# Patient Record
Sex: Female | Born: 1975 | Race: White | Hispanic: No | Smoking: Former smoker
Health system: Southern US, Community
[De-identification: ages and names within clinical notes are randomized; demographics above are authoritative.]

## PROBLEM LIST (undated history)

## (undated) DIAGNOSIS — F209 Schizophrenia, unspecified: Secondary | ICD-10-CM

## (undated) DIAGNOSIS — R569 Unspecified convulsions: Secondary | ICD-10-CM

## (undated) DIAGNOSIS — I519 Heart disease, unspecified: Secondary | ICD-10-CM

## (undated) DIAGNOSIS — F1920 Other psychoactive substance dependence, uncomplicated: Secondary | ICD-10-CM

## (undated) HISTORY — PX: CARDIAC VALVE REPLACEMENT: SHX585

## (undated) HISTORY — PX: ORIF PROXIMAL HUMERUS FRACTURE W PROSTHETIC REPLACEMENT: SUR952

## (undated) HISTORY — DX: Other psychoactive substance dependence, uncomplicated: F19.20

## (undated) HISTORY — DX: Heart disease, unspecified: I51.9

## (undated) HISTORY — PX: ORIF ULNAR FRACTURE: SHX5417

---

## 2005-05-23 HISTORY — PX: BONE FLAP RECONSTRUCTION SKULL: SHX5176

## 2017-03-25 ENCOUNTER — Encounter (HOSPITAL_COMMUNITY): Payer: Self-pay | Admitting: Emergency Medicine

## 2017-03-25 ENCOUNTER — Emergency Department (HOSPITAL_COMMUNITY)
Admission: EM | Admit: 2017-03-25 | Discharge: 2017-03-25 | Disposition: A | Discharge status: Home / Self Care | Payer: Self-pay | Attending: Emergency Medicine | Admitting: Emergency Medicine

## 2017-03-25 DIAGNOSIS — Z87891 Personal history of nicotine dependence: Secondary | ICD-10-CM | POA: Insufficient documentation

## 2017-03-25 DIAGNOSIS — F209 Schizophrenia, unspecified: Secondary | ICD-10-CM | POA: Insufficient documentation

## 2017-03-25 DIAGNOSIS — Z79899 Other long term (current) drug therapy: Secondary | ICD-10-CM | POA: Insufficient documentation

## 2017-03-25 DIAGNOSIS — R569 Unspecified convulsions: Secondary | ICD-10-CM | POA: Insufficient documentation

## 2017-03-25 HISTORY — DX: Schizophrenia, unspecified: F20.9

## 2017-03-25 HISTORY — DX: Unspecified convulsions: R56.9

## 2017-03-25 LAB — CBC WITH DIFFERENTIAL/PLATELET
BASOS ABS: 0 10*3/uL (ref 0.0–0.1)
BASOS PCT: 0 %
EOS PCT: 5 %
Eosinophils Absolute: 0.3 10*3/uL (ref 0.0–0.7)
HCT: 37.5 % (ref 36.0–46.0)
Hemoglobin: 12.1 g/dL (ref 12.0–15.0)
Lymphocytes Relative: 33 %
Lymphs Abs: 1.9 10*3/uL (ref 0.7–4.0)
MCH: 31 pg (ref 26.0–34.0)
MCHC: 32.3 g/dL (ref 30.0–36.0)
MCV: 96.2 fL (ref 78.0–100.0)
MONO ABS: 0.5 10*3/uL (ref 0.1–1.0)
Monocytes Relative: 8 %
Neutro Abs: 3.2 10*3/uL (ref 1.7–7.7)
Neutrophils Relative %: 54 %
PLATELETS: 210 10*3/uL (ref 150–400)
RBC: 3.9 MIL/uL (ref 3.87–5.11)
RDW: 14.2 % (ref 11.5–15.5)
WBC: 5.9 10*3/uL (ref 4.0–10.5)

## 2017-03-25 LAB — COMPREHENSIVE METABOLIC PANEL
ALBUMIN: 3.2 g/dL — AB (ref 3.5–5.0)
ALT: 11 U/L — ABNORMAL LOW (ref 14–54)
ANION GAP: 9 (ref 5–15)
AST: 16 U/L (ref 15–41)
Alkaline Phosphatase: 49 U/L (ref 38–126)
BILIRUBIN TOTAL: 0.6 mg/dL (ref 0.3–1.2)
BUN: 16 mg/dL (ref 6–20)
CALCIUM: 8.5 mg/dL — AB (ref 8.9–10.3)
CO2: 25 mmol/L (ref 22–32)
Chloride: 104 mmol/L (ref 101–111)
Creatinine, Ser: 0.6 mg/dL (ref 0.44–1.00)
Glucose, Bld: 97 mg/dL (ref 65–99)
Potassium: 3.7 mmol/L (ref 3.5–5.1)
Sodium: 138 mmol/L (ref 135–145)
TOTAL PROTEIN: 6.3 g/dL — AB (ref 6.5–8.1)

## 2017-03-25 LAB — RAPID URINE DRUG SCREEN, HOSP PERFORMED
AMPHETAMINES: NOT DETECTED
BARBITURATES: NOT DETECTED
BENZODIAZEPINES: NOT DETECTED
Cocaine: NOT DETECTED
Opiates: NOT DETECTED
TETRAHYDROCANNABINOL: NOT DETECTED

## 2017-03-25 LAB — URINALYSIS, ROUTINE W REFLEX MICROSCOPIC
BILIRUBIN URINE: NEGATIVE
Glucose, UA: NEGATIVE mg/dL
Hgb urine dipstick: NEGATIVE
KETONES UR: NEGATIVE mg/dL
Leukocytes, UA: NEGATIVE
NITRITE: NEGATIVE
Protein, ur: NEGATIVE mg/dL
Specific Gravity, Urine: 1.015 (ref 1.005–1.030)
pH: 7 (ref 5.0–8.0)

## 2017-03-25 LAB — CBG MONITORING, ED: GLUCOSE-CAPILLARY: 100 mg/dL — AB (ref 65–99)

## 2017-03-25 LAB — VALPROIC ACID LEVEL: VALPROIC ACID LVL: 29 ug/mL — AB (ref 50.0–100.0)

## 2017-03-25 LAB — LIPASE, BLOOD: LIPASE: 22 U/L (ref 11–51)

## 2017-03-25 MED ORDER — DIVALPROEX SODIUM 250 MG PO DR TAB: 250.0000 mg | DELAYED_RELEASE_TABLET | Freq: Two times a day (BID) | ORAL | 1 refills | Status: DC

## 2017-03-25 MED ORDER — SODIUM CHLORIDE 0.9 % IV BOLUS (SEPSIS)
1000.0000 mL | Freq: Once | INTRAVENOUS | Status: AC
  Administered 2017-03-25: 1000 mL via INTRAVENOUS

## 2017-03-25 MED ORDER — ONDANSETRON HCL 4 MG/2ML IJ SOLN
4.0000 mg | Freq: Once | INTRAMUSCULAR | Status: AC
  Administered 2017-03-25: 4 mg via INTRAVENOUS
  Filled 2017-03-25: qty 2

## 2017-03-25 MED ORDER — DIVALPROEX SODIUM 500 MG PO DR TAB: 500.0000 mg | DELAYED_RELEASE_TABLET | Freq: Two times a day (BID) | ORAL | 1 refills | Status: DC

## 2017-03-25 MED ORDER — QUETIAPINE FUMARATE 100 MG PO TABS: 100.0000 mg | ORAL_TABLET | Freq: Every day | ORAL | 1 refills | Status: DC

## 2017-03-25 MED ORDER — LORAZEPAM 2 MG/ML IJ SOLN
2.0000 mg | Freq: Once | INTRAMUSCULAR | Status: AC
  Administered 2017-03-25: 2 mg via INTRAVENOUS
  Filled 2017-03-25: qty 1

## 2017-03-25 NOTE — ED Notes (Signed)
Bed: [id number] Expected date: 03/25/17 Expected time: 12:33 PM Means of arrival: Ambulance Comments: Seizures

## 2017-03-25 NOTE — ED Triage Notes (Signed)
Pt from [facility](half-way house). Per EMS, pt has been at this facility for 2 weeks. 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 Staff has taken pt off of her rx'd Tramadol, Vistaril, Cymbalta, and Seroquel. Pt was allowed to stay on her Depakote for hx of seizures. EMS reports that the pt was not postictal upon arrival, but had a witnessed seizure per staff. Pt did not hit head or suffer other injuries during seizure. Pt biggest concern is that she does not want to lose her spot at [facility]. Pt is A&O and in NAD

## 2017-03-25 NOTE — Discharge Instructions (Signed)
Prescription for Depakote and Seroquel.  Increase fluids.  Avoid stress.

## 2017-03-25 NOTE — ED Triage Notes (Signed)
Pt reports that she has been at [facility] for 15 days. 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 Pt is A&O, speaks in complete sentences, but is visibly shaking and anxious. Pt in NAD and VSS
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
## 2017-05-12 ENCOUNTER — Other Ambulatory Visit: Payer: Self-pay

## 2017-05-12 ENCOUNTER — Emergency Department (HOSPITAL_COMMUNITY)
Admission: EM | Admit: 2017-05-12 | Discharge: 2017-05-12 | Disposition: A | Discharge status: Home / Self Care | Payer: Self-pay | Attending: Emergency Medicine | Admitting: Emergency Medicine

## 2017-05-12 ENCOUNTER — Emergency Department (HOSPITAL_COMMUNITY): Payer: Self-pay

## 2017-05-12 ENCOUNTER — Encounter (HOSPITAL_COMMUNITY): Payer: Self-pay | Admitting: Emergency Medicine

## 2017-05-12 DIAGNOSIS — Z87891 Personal history of nicotine dependence: Secondary | ICD-10-CM | POA: Insufficient documentation

## 2017-05-12 DIAGNOSIS — R569 Unspecified convulsions: Secondary | ICD-10-CM | POA: Insufficient documentation

## 2017-05-12 DIAGNOSIS — Z79899 Other long term (current) drug therapy: Secondary | ICD-10-CM | POA: Insufficient documentation

## 2017-05-12 LAB — URINALYSIS, ROUTINE W REFLEX MICROSCOPIC
BILIRUBIN URINE: NEGATIVE
GLUCOSE, UA: NEGATIVE mg/dL
HGB URINE DIPSTICK: NEGATIVE
KETONES UR: 5 mg/dL — AB
LEUKOCYTES UA: NEGATIVE
NITRITE: NEGATIVE
PROTEIN: 30 mg/dL — AB
Specific Gravity, Urine: 1.036 — ABNORMAL HIGH (ref 1.005–1.030)
pH: 5 (ref 5.0–8.0)

## 2017-05-12 LAB — CBC WITH DIFFERENTIAL/PLATELET
BASOS ABS: 0 10*3/uL (ref 0.0–0.1)
Basophils Relative: 0 %
EOS ABS: 0.5 10*3/uL (ref 0.0–0.7)
EOS PCT: 7 %
HCT: 40.2 % (ref 36.0–46.0)
Hemoglobin: 13.1 g/dL (ref 12.0–15.0)
Lymphocytes Relative: 42 %
Lymphs Abs: 2.9 10*3/uL (ref 0.7–4.0)
MCH: 30.1 pg (ref 26.0–34.0)
MCHC: 32.6 g/dL (ref 30.0–36.0)
MCV: 92.4 fL (ref 78.0–100.0)
MONO ABS: 0.4 10*3/uL (ref 0.1–1.0)
Monocytes Relative: 6 %
Neutro Abs: 3.1 10*3/uL (ref 1.7–7.7)
Neutrophils Relative %: 45 %
PLATELETS: 213 10*3/uL (ref 150–400)
RBC: 4.35 MIL/uL (ref 3.87–5.11)
RDW: 13.7 % (ref 11.5–15.5)
WBC: 6.9 10*3/uL (ref 4.0–10.5)

## 2017-05-12 LAB — RAPID URINE DRUG SCREEN, HOSP PERFORMED
Amphetamines: NOT DETECTED
Barbiturates: NOT DETECTED
Benzodiazepines: NOT DETECTED
Cocaine: NOT DETECTED
OPIATES: NOT DETECTED
Tetrahydrocannabinol: NOT DETECTED

## 2017-05-12 LAB — COMPREHENSIVE METABOLIC PANEL
ALT: 9 U/L — AB (ref 14–54)
AST: 18 U/L (ref 15–41)
Albumin: 3.5 g/dL (ref 3.5–5.0)
Alkaline Phosphatase: 56 U/L (ref 38–126)
Anion gap: 6 (ref 5–15)
BUN: 11 mg/dL (ref 6–20)
CHLORIDE: 105 mmol/L (ref 101–111)
CO2: 26 mmol/L (ref 22–32)
CREATININE: 0.5 mg/dL (ref 0.44–1.00)
Calcium: 8.6 mg/dL — ABNORMAL LOW (ref 8.9–10.3)
GFR calc non Af Amer: >60 mL/min (ref >60)
Glucose, Bld: 90 mg/dL (ref 65–99)
POTASSIUM: 3.9 mmol/L (ref 3.5–5.1)
SODIUM: 137 mmol/L (ref 135–145)
Total Bilirubin: 0.8 mg/dL (ref 0.3–1.2)
Total Protein: 6.8 g/dL (ref 6.5–8.1)

## 2017-05-12 LAB — I-STAT BETA HCG BLOOD, ED (MC, WL, AP ONLY)

## 2017-05-12 LAB — CBG MONITORING, ED: GLUCOSE-CAPILLARY: 82 mg/dL (ref 65–99)

## 2017-05-12 LAB — I-STAT TROPONIN, ED: TROPONIN I, POC: 0 ng/mL (ref 0.00–0.08)

## 2017-05-12 LAB — VALPROIC ACID LEVEL: VALPROIC ACID LVL: 68 ug/mL (ref 50.0–100.0)

## 2017-05-12 MED ORDER — IBUPROFEN 200 MG PO TABS
600.0000 mg | ORAL_TABLET | Freq: Once | ORAL | Status: AC
  Administered 2017-05-12: 600 mg via ORAL
  Filled 2017-05-12: qty 3

## 2017-05-12 NOTE — Discharge Instructions (Signed)
Your evaluation today is reassuring, labs are reassuring, and Depakote level is appropriate. Continue to take your seizure medications as directed.  [facility]up with [facility] regarding back pain, if you develop numbness, weakness or difficulty walking, or other new or concerning symptoms return to the ED for sooner reevaluation.  You may also schedule appointment [facility] for continued management of your seizures.

## 2017-05-12 NOTE — ED Provider Notes (Signed)
Medical screening examination/treatment/procedure(s) were conducted as a shared visit with non-physician practitioner(s) and myself.  I personally evaluated the patient during the encounter.   EKG Interpretation  Date/Time:  Friday May 12 2017 15:10:29 EST Ventricular Rate:  85 PR Interval:    QRS Duration: 99 QT Interval:  403 QTC Calculation: 480 R Axis:   59 Text Interpretation:  Sinus rhythm Borderline T wave abnormalities No previous tracing Confirmed by Vanetta MuldersZackowski, Zaheer Wageman [phone]([id number]) on 05/12/2017 3:21:15 PM      Patient seen by me along with the physician assistant.  Patient currently in a rehab situation.  Patient had a witnessed seizure today at 10 this morning that lasted for minutes.  Associated with incontinence of urine.  Since that time she has had ongoing generalized weakness mostly in her legs this is normal for her for her postictal period which been different is that for the past several days to weeks she has had the lower extremity pain which is bilateral.  Patient's Depakote levels are normal.  Patient back to baseline.  Patient most likely can be discharged home.  Outpatient follow-up for the persistent leg pain.  Patient currently stable.  Labs still pending.  If labs do not warrant admission and patient is stable for discharge home.  Results for orders placed or performed during the hospital encounter of 05/12/17  Valproic acid level  Result Value Ref Range   Valproic Acid Lvl 68 50.0 - 100.0 ug/mL  CBC WITH DIFFERENTIAL  Result Value Ref Range   WBC 6.9 4.0 - 10.5 K/uL   RBC 4.35 3.87 - 5.11 MIL/uL   Hemoglobin 13.1 12.0 - 15.0 g/dL   HCT 11.940.2 14.736.0 - 82.946.0 %   MCV 92.4 78.0 - 100.0 fL   MCH 30.1 26.0 - 34.0 pg   MCHC 32.6 30.0 - 36.0 g/dL   RDW 56.213.7 13.011.5 - 86.515.5 %   Platelets 213 150 - 400 K/uL   Neutrophils Relative % 45 %   Neutro Abs 3.1 1.7 - 7.7 K/uL   Lymphocytes Relative 42 %   Lymphs Abs 2.9 0.7 - 4.0 K/uL   Monocytes Relative 6 %   Monocytes  Absolute 0.4 0.1 - 1.0 K/uL   Eosinophils Relative 7 %   Eosinophils Absolute 0.5 0.0 - 0.7 K/uL   Basophils Relative 0 %   Basophils Absolute 0.0 0.0 - 0.1 K/uL  Comprehensive metabolic panel  Result Value Ref Range   Sodium 137 135 - 145 mmol/L   Potassium 3.9 3.5 - 5.1 mmol/L   Chloride 105 101 - 111 mmol/L   CO2 26 22 - 32 mmol/L   Glucose, Bld 90 65 - 99 mg/dL   BUN 11 6 - 20 mg/dL   Creatinine, Ser 7.840.50 0.44 - 1.00 mg/dL   Calcium 8.6 (L) 8.9 - 10.3 mg/dL   Total Protein 6.8 6.5 - 8.1 g/dL   Albumin 3.5 3.5 - 5.0 g/dL   AST 18 15 - 41 U/L   ALT 9 (L) 14 - 54 U/L   Alkaline Phosphatase 56 38 - 126 U/L   Total Bilirubin 0.8 0.3 - 1.2 mg/dL   GFR calc non Af Amer >60 >60 mL/min   GFR calc Af Amer >60 >60 mL/min   Anion gap 6 5 - 15  Urinalysis, Routine w reflex microscopic  Result Value Ref Range   Color, Urine YELLOW YELLOW   APPearance CLOUDY (A) CLEAR   Specific Gravity, Urine 1.036 (H) 1.005 - 1.030   pH 5.0 5.0 -  8.0   Glucose, UA NEGATIVE NEGATIVE mg/dL   Hgb urine dipstick NEGATIVE NEGATIVE   Bilirubin Urine NEGATIVE NEGATIVE   Ketones, ur 5 (A) NEGATIVE mg/dL   Protein, ur 30 (A) NEGATIVE mg/dL   Nitrite NEGATIVE NEGATIVE   Leukocytes, UA NEGATIVE NEGATIVE   RBC / HPF 0-5 0 - 5 RBC/hpf   WBC, UA 6-30 0 - 5 WBC/hpf   Bacteria, UA FEW (A) NONE SEEN   Squamous Epithelial / LPF TOO NUMEROUS TO COUNT (A) NONE SEEN   Mucus PRESENT   Urine rapid drug screen (hosp performed)  Result Value Ref Range   Opiates NONE DETECTED NONE DETECTED   Cocaine NONE DETECTED NONE DETECTED   Benzodiazepines NONE DETECTED NONE DETECTED   Amphetamines NONE DETECTED NONE DETECTED   Tetrahydrocannabinol NONE DETECTED NONE DETECTED   Barbiturates NONE DETECTED NONE DETECTED  CBG monitoring, ED  Result Value Ref Range   Glucose-Capillary 82 65 - 99 mg/dL   Comment 1 Notify RN    Comment 2 Document in Chart   I-Stat beta hCG blood, ED  Result Value Ref Range   I-stat hCG,  quantitative <5.0 <5 mIU/mL   Comment 3              Vanetta MuldersZackowski, Marialy Urbanczyk, MD 05/12/17 1904
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
## 2017-05-12 NOTE — ED Triage Notes (Signed)
Pt complaint of seizure at 1000 today; seizure witnessed lasting 4 minutes; incontinence with seizure; ongoing generalized weakness since event.

## 2017-05-12 NOTE — ED Notes (Signed)
Bed: WA08 Expected date:  Expected time:  Means of arrival:  Comments: Triage 1 

## 2017-05-17 ENCOUNTER — Encounter: Payer: Self-pay | Admitting: Family Medicine

## 2017-05-17 ENCOUNTER — Ambulatory Visit (INDEPENDENT_AMBULATORY_CARE_PROVIDER_SITE_OTHER): Payer: Self-pay | Admitting: Family Medicine

## 2017-05-17 VITALS — BP 121/76 | HR 94 | Temp 97.9°F | Resp 18 | Ht 58.5 in | Wt 240.0 lb

## 2017-05-17 DIAGNOSIS — E8881 Metabolic syndrome: Secondary | ICD-10-CM

## 2017-05-17 DIAGNOSIS — G40401 Other generalized epilepsy and epileptic syndromes, not intractable, with status epilepticus: Secondary | ICD-10-CM

## 2017-05-17 DIAGNOSIS — Z1231 Encounter for screening mammogram for malignant neoplasm of breast: Secondary | ICD-10-CM

## 2017-05-17 DIAGNOSIS — N926 Irregular menstruation, unspecified: Secondary | ICD-10-CM

## 2017-05-17 DIAGNOSIS — F3130 Bipolar disorder, current episode depressed, mild or moderate severity, unspecified: Secondary | ICD-10-CM

## 2017-05-17 DIAGNOSIS — F319 Bipolar disorder, unspecified: Secondary | ICD-10-CM

## 2017-05-17 LAB — POCT GLYCOSYLATED HEMOGLOBIN (HGB A1C): HEMOGLOBIN A1C: 5.3

## 2017-05-17 MED ORDER — DIVALPROEX SODIUM 250 MG PO DR TAB: 250.0000 mg | DELAYED_RELEASE_TABLET | Freq: Two times a day (BID) | ORAL | 5 refills | Status: DC

## 2017-05-17 MED ORDER — DIVALPROEX SODIUM 500 MG PO DR TAB: 500.0000 mg | DELAYED_RELEASE_TABLET | Freq: Two times a day (BID) | ORAL | 5 refills | Status: DC
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
## 2017-05-17 NOTE — Patient Instructions (Addendum)
Recommend a lowfat, low carbohydrate diet divided over 5-6 small meals, increase water intake to 6-8 glasses, and 150 minutes per week of cardiovascular exercise.   Will send an order for screening mammogram   Will follow up by phone with any laboratory results  Sent a referral to neurololgy for workup of epilepsy  Sent a referral to psychiatry for history of bipolar depression   Menopause is the time that marks the end of your menstrual cycles. It's diagnosed after you've gone 12 months without a menstrual period.  Menopause can happen in your 4040s or 6350s, but the average age is 6451 in the [place]. Menopause is a natural biological process. But the physical symptoms, such as hot flashes, and emotional symptoms of menopause may disrupt your sleep, lower your energy or affect emotional health.  There are many effective treatments available, from lifestyle adjustments to hormone therapy.   Epilepsy Epilepsy is when a person keeps having seizures. A seizure is unusual activity in the brain. A seizure can change how you think or behave, and it can make it hard to be aware of what is happening. This condition can cause problems, such as:  Falls, accidents, and injury.  Depression.  Poor memory.  Sudden unexplained death in epilepsy (SUDEP). This is rare. Its cause is not known.  Most people with epilepsy lead normal lives. Follow these instructions at home: Medicines   Take medicines only as told by your doctor.  Avoid anything that may keep your medicine from working, such as alcohol. Activity  Get enough rest. Lack of sleep can make seizures more likely to occur.  Follow your doctor's advice about driving, swimming, and doing anything else that would be dangerous if you had a seizure. Teaching others Teach friends and family what to do if you have a seizure. They should:  Lay you on the ground to prevent a fall.  Cushion your head and body.  Loosen any tight clothing  around your neck.  Turn you on your side.  Stay with you until you are better.  Not hold you down.  Not put anything in your mouth.  Know whether or not you need emergency care.  General instructions  Avoid anything that causes you to have seizures.  Keep a seizure diary. Write down what you remember about each seizure, and especially what might have caused it.  Keep all follow-up visits as told by your doctor. This is important. Contact a doctor if:  You have a change in your seizure pattern.  You get an infection or start to feel sick. You may have more seizures when you are sick. Get help right away if:  A seizure does not stop after 5 minutes.  You have more than one seizure in a row, and you do not have enough time between the seizures to feel better.  A seizure makes it harder to breathe.  A seizure is different from other seizures you have had.  A seizure makes you unable to speak or use a part of your body.  You did not wake up right after a seizure. This information is not intended to replace advice given to you by your health care provider. Make sure you discuss any questions you have with your health care provider. Document Released: 03/06/2009 Document Revised: 12/14/2015 Document Reviewed: 11/17/2015 Elsevier Interactive Patient Education  [employer]2018 Elsevier Inc.

## 2017-05-18 ENCOUNTER — Other Ambulatory Visit: Payer: Self-pay | Admitting: Family Medicine

## 2017-05-18 DIAGNOSIS — E559 Vitamin D deficiency, unspecified: Secondary | ICD-10-CM

## 2017-05-18 LAB — VITAMIN D 25 HYDROXY (VIT D DEFICIENCY, FRACTURES): VIT D 25 HYDROXY: 19.4 ng/mL — AB (ref 30.0–100.0)

## 2017-05-18 LAB — TSH: TSH: 1.09 u[IU]/mL (ref 0.450–4.500)

## 2017-05-18 MED ORDER — ERGOCALCIFEROL 1.25 MG (50000 UT) PO CAPS: 50000.0000 [IU] | ORAL_CAPSULE | ORAL | 0 refills | Status: DC

## 2017-05-18 NOTE — Progress Notes (Signed)
Meds ordered this encounter  Medications  . ergocalciferol (VITAMIN D2) 50000 units capsule    Sig: Take 1 capsule (50,000 Units total) by mouth once a week.    Dispense:  30 capsule    Refill:  0     Nolon NationsLachina Moore Ameirah Khatoon  MSN, FNP-C Patient [facility] [address]  [place], [place] [address] [phone]

## 2017-05-19 ENCOUNTER — Telehealth: Payer: Self-pay

## 2017-05-19 NOTE — Telephone Encounter (Signed)
-----   Message from Massie MaroonLachina M Hollis, [place] sent at 05/18/2017  4:37 PM EST ----- Regarding: lab results Please inform patient of vitamin d deficiency. Will start weekly Drisdol 50,000 units per week. Will recheck level in 3 months. All other labs are within normal limits.   Thanks

## 2017-05-19 NOTE — Telephone Encounter (Signed)
Called and spoke with patient. Advised that vitamin D was low and that she should start Drisdol 50,000 units once per week. Advised that medication was sent into pharmacy and that we will recheck level in 3 months. Advised that all other labs were normal and to keep next scheduled appointment. Patient verbalized understanding. Thanks!

## 2017-05-20 DIAGNOSIS — G40909 Epilepsy, unspecified, not intractable, without status epilepticus: Secondary | ICD-10-CM | POA: Insufficient documentation

## 2017-05-20 DIAGNOSIS — E8881 Metabolic syndrome: Secondary | ICD-10-CM | POA: Insufficient documentation

## 2017-06-06 ENCOUNTER — Ambulatory Visit: Payer: Self-pay | Attending: Internal Medicine

## 2017-06-14 ENCOUNTER — Ambulatory Visit (INDEPENDENT_AMBULATORY_CARE_PROVIDER_SITE_OTHER): Payer: Self-pay | Admitting: Family Medicine

## 2017-06-14 ENCOUNTER — Encounter: Payer: Self-pay | Admitting: Family Medicine

## 2017-06-14 VITALS — BP 127/73 | HR 85 | Temp 97.7°F | Resp 18 | Ht 58.5 in | Wt 235.0 lb

## 2017-06-14 DIAGNOSIS — J029 Acute pharyngitis, unspecified: Secondary | ICD-10-CM

## 2017-06-14 DIAGNOSIS — R6883 Chills (without fever): Secondary | ICD-10-CM

## 2017-06-14 DIAGNOSIS — R05 Cough: Secondary | ICD-10-CM

## 2017-06-14 DIAGNOSIS — R059 Cough, unspecified: Secondary | ICD-10-CM

## 2017-06-14 DIAGNOSIS — R0981 Nasal congestion: Secondary | ICD-10-CM

## 2017-06-14 LAB — POCT RAPID STREP A (OFFICE): Rapid Strep A Screen: NEGATIVE

## 2017-06-14 LAB — POCT INFLUENZA A/B
Influenza A, POC: NEGATIVE
Influenza B, POC: NEGATIVE

## 2017-06-14 MED ORDER — ACETAMINOPHEN 500 MG PO TABS: 500.0000 mg | ORAL_TABLET | Freq: Four times a day (QID) | ORAL | 0 refills | Status: DC | PRN

## 2017-06-14 MED ORDER — BENZONATATE 100 MG PO CAPS: 100.0000 mg | ORAL_CAPSULE | Freq: Three times a day (TID) | ORAL | 0 refills | Status: DC | PRN

## 2017-06-14 MED FILL — BENZONATATE 100 MG CAPSULE: 100 | 10 days supply | Qty: 30 | Fill #0

## 2017-06-14 NOTE — Patient Instructions (Addendum)
Your strep test was negative.  Will follow strep culture to completion. Recommend warm salt water gargles as needed for throat pain.  Also recommend Tylenol 500 mg every 6 hours as needed for mild to moderate pain.  For upper respiratory symptoms, increase fluids, rest, and handwashing.  Your influenza test was also negative. Sore Throat When you have a sore throat, your throat may:  Hurt.  Burn.  Feel irritated.  Feel scratchy.  Many things can cause a sore throat, including:  An infection.  Allergies.  Dryness in the air.  Smoke or pollution.  Gastroesophageal reflux disease (GERD).  A tumor.  A sore throat can be the first sign of another sickness. It can happen with other problems, like coughing or a fever. Most sore throats go away without treatment. Follow these instructions at home:  Take over-the-counter medicines only as told by your doctor.  Drink enough fluids to keep your pee (urine) clear or pale yellow.  Rest when you feel you need to.  To help with pain, try: ? Sipping warm liquids, such as broth, herbal tea, or warm water. ? Eating or drinking cold or frozen liquids, such as frozen ice pops. ? Gargling with a salt-water mixture 3-4 times a day or as needed. To make a salt-water mixture, add -1 tsp of salt in 1 cup of warm water. Mix it until you cannot see the salt anymore. ? Sucking on hard candy or throat lozenges. ? Putting a cool-mist humidifier in your bedroom at night. ? Sitting in the bathroom with the door closed for 5-10 minutes while you run hot water in the shower.  Do not use any tobacco products, such as cigarettes, chewing tobacco, and e-cigarettes. If you need help quitting, ask your doctor. Contact a doctor if:  You have a fever for more than 2-3 days.  You keep having symptoms for more than 2-3 days.  Your throat does not get better in 7 days.  You have a fever and your symptoms suddenly get worse. Get help right away if:  You  have trouble breathing.  You cannot swallow fluids, soft foods, or your saliva.  You have swelling in your throat or neck that gets worse.  You keep feeling like you are going to throw up (vomit).  You keep throwing up. This information is not intended to replace advice given to you by your health care provider. Make sure you discuss any questions you have with your health care provider. Document Released: 02/16/2008 Document Revised: 01/03/2016 Document Reviewed: 02/27/2015 Elsevier Interactive Patient Education  2018 Elsevier Inc.  Upper Respiratory Infection, Adult Most upper respiratory infections (URIs) are caused by a virus. A URI affects the nose, throat, and upper air passages. The most common type of URI is often called "the common cold." Follow these instructions at home:  Take medicines only as told by your doctor.  Gargle warm saltwater or take cough drops to comfort your throat as told by your doctor.  Use a warm mist humidifier or inhale steam from a shower to increase air moisture. This may make it easier to breathe.  Drink enough fluid to keep your pee (urine) clear or pale yellow.  Eat soups and other clear broths.  Have a healthy diet.  Rest as needed.  Go back to work when your fever is gone or your doctor says it is okay. ? You may need to stay home longer to avoid giving your URI to others. ? You can also wear  a face mask and wash your hands often to prevent spread of the virus.  Use your inhaler more if you have asthma.  Do not use any tobacco products, including cigarettes, chewing tobacco, or electronic cigarettes. If you need help quitting, ask your doctor. Contact a doctor if:  You are getting worse, not better.  Your symptoms are not helped by medicine.  You have chills.  You are getting more short of breath.  You have brown or red mucus.  You have yellow or brown discharge from your nose.  You have pain in your face, especially when you  bend forward.  You have a fever.  You have puffy (swollen) neck glands.  You have pain while swallowing.  You have white areas in the back of your throat. Get help right away if:  You have very bad or constant: ? Headache. ? Ear pain. ? Pain in your forehead, behind your eyes, and over your cheekbones (sinus pain). ? Chest pain.  You have long-lasting (chronic) lung disease and any of the following: ? Wheezing. ? Long-lasting cough. ? Coughing up blood. ? A change in your usual mucus.  You have a stiff neck.  You have changes in your: ? Vision. ? Hearing. ? Thinking. ? Mood. This information is not intended to replace advice given to you by your health care provider. Make sure you discuss any questions you have with your health care provider. Document Released: 10/26/2007 Document Revised: 01/10/2016 Document Reviewed: 08/14/2013 Elsevier Interactive Patient Education  2018 [employer]

## 2017-06-14 NOTE — Progress Notes (Signed)
Subjective:     Rhonda Bender is a 42 y.o. female who presents for evaluation of upper respiratory symptoms. Symptoms include achiness, congestion, cough described as productive, post nasal drip and sore throat. Onset of symptoms was 3 days ago, and have been worsening since that time. Patient has not attempted OTC interventions to alleviate current symptoms.    Past Medical History:  Diagnosis Date  . Schizophrenia (HCC)   . Seizures (HCC)    Social History   Socioeconomic History  . Marital status: Divorced    Spouse name: Not on file  . Number of children: Not on file  . Years of education: Not on file  . Highest education level: Not on file  Social Needs  . Financial resource strain: Not on file  . Food insecurity - worry: Not on file  . Food insecurity - inability: Not on file  . Transportation needs - medical: Not on file  . Transportation needs - non-medical: Not on file  Occupational History  . Not on file  Tobacco Use  . Smoking status: Former Smoker    Last attempt to quit: 03/04/2017    Years since quitting: 0.2  . Smokeless tobacco: Never Used  Substance and Sexual Activity  . Alcohol use: No  . Drug use: No  . Sexual activity: Not Currently  Other Topics Concern  . Not on file  Social History Narrative  . Not on file    There is no immunization history on file for this patient. Allergies  Allergen Reactions  . Toradol [Ketorolac Tromethamine] Palpitations    CARDIAC  ARREST  Review of Systems  Constitutional: Positive for chills and malaise/fatigue.  HENT: Positive for congestion and sore throat.   Respiratory: Positive for cough.   Cardiovascular: Negative.   Gastrointestinal: Negative for abdominal pain and nausea.  Genitourinary: Negative.   Musculoskeletal: Negative.   Skin: Negative.   Neurological: Negative.   Psychiatric/Behavioral: Negative.     Objective:  Physical Exam  Constitutional: She appears ill.  HENT:  Right Ear: Tympanic  membrane is erythematous.  Left Ear: Tympanic membrane is erythematous.  Nose: Mucosal edema present.  Mouth/Throat: Posterior oropharyngeal erythema present.  Cardiovascular: Normal rate, regular rhythm and normal heart sounds.  Pulmonary/Chest: Effort normal and breath sounds normal.  Abdominal: Soft.    Assessment:    viral upper respiratory illness   Plan:  1. Sore throat Recommend warm salt water gargles.  - Rapid Strep A - Culture, Group A Strep  2. Chills (without fever) - Influenza A/B - acetaminophen (TYLENOL) 500 MG tablet; Take 1 tablet (500 mg total) by mouth every 6 (six) hours as needed.  Dispense: 30 tablet; Refill: 0  3. Cough in adult - benzonatate (TESSALON PERLES) 100 MG capsule; Take 1 capsule (100 mg total) by mouth 3 (three) times daily as needed for cough.  Dispense: 30 capsule; Refill: 0  4. Nasal congestion  Discussed diagnosis and treatment of URI. Suggested symptomatic OTC remedies. Nasal saline spray for congestion. Follow up as needed.     RTC: Follow up as needed   The patient was given clear instructions to go to ER or return to medical center if symptoms do not improve, worsen or new problems develop. The patient verbalized understanding.   Rhonda NationsLachina Moore Davy Westmoreland  MSN, FNP-C [facility] [address]  [place], [place] [address] [phone]

## 2017-06-17 LAB — CULTURE, GROUP A STREP: STREP A CULTURE: NEGATIVE

## 2017-06-22 MED FILL — DIVALPROEX SOD DR 500 MG TA: 500 | 30 days supply | Qty: 60 | Fill #0

## 2017-06-22 MED FILL — DIVALPROEX SOD DR 250 MG TA: 250 | 30 days supply | Qty: 60 | Fill #0

## 2017-07-11 ENCOUNTER — Encounter: Payer: Self-pay | Admitting: Neurology

## 2017-07-11 ENCOUNTER — Other Ambulatory Visit: Payer: Self-pay | Admitting: Obstetrics and Gynecology

## 2017-07-11 ENCOUNTER — Ambulatory Visit (INDEPENDENT_AMBULATORY_CARE_PROVIDER_SITE_OTHER): Payer: Self-pay | Admitting: Neurology

## 2017-07-11 VITALS — BP 134/78 | HR 80 | Ht 63.0 in | Wt 238.0 lb

## 2017-07-11 DIAGNOSIS — Z1231 Encounter for screening mammogram for malignant neoplasm of breast: Secondary | ICD-10-CM

## 2017-07-11 DIAGNOSIS — R569 Unspecified convulsions: Secondary | ICD-10-CM | POA: Insufficient documentation

## 2017-07-11 DIAGNOSIS — M7989 Other specified soft tissue disorders: Secondary | ICD-10-CM
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
## 2017-07-25 ENCOUNTER — Other Ambulatory Visit: Payer: Self-pay

## 2017-07-25 ENCOUNTER — Telehealth: Payer: Self-pay | Admitting: Neurology

## 2017-07-25 DIAGNOSIS — M7989 Other specified soft tissue disorders: Secondary | ICD-10-CM

## 2017-07-25 NOTE — Telephone Encounter (Signed)
Please Re order  DOPPLER VENOUS LEGS BILATERAL (Order [id number])    order #  [id number] .   Called Devina and left her a message to call me back about EEG [phone].

## 2017-07-25 NOTE — Addendum Note (Signed)
Addended by: Geronimo RunningINKINS, Betsaida [place] A on: 07/25/2017 02:26 PM   Modules accepted: Orders

## 2017-07-25 NOTE — Telephone Encounter (Signed)
Order placed

## 2017-07-25 NOTE — Telephone Encounter (Signed)
Pt called stating that Dr. Terrace ArabiaYan wanted her to get an ultra sound of her right like to rule out DVT but isnt sure where the order has been sent if one has. Pt has already scheduled her MRI and EEG pt requesting a call back to know where order is going to be sent. Pt will be at work till 6 contact at [phone]

## 2017-07-25 NOTE — Telephone Encounter (Signed)
Noted Done. Patient is scheduled for her Venous Doppler.

## 2017-07-25 NOTE — Telephone Encounter (Signed)
Patient has already been scheduled for EEG .

## 2017-07-31 ENCOUNTER — Ambulatory Visit
Admission: RE | Admit: 2017-07-31 | Discharge: 2017-07-31 | Disposition: A | Discharge status: Home / Self Care | Payer: Medicaid Other | Source: Ambulatory Visit | Attending: Neurology | Admitting: Neurology

## 2017-07-31 DIAGNOSIS — M7989 Other specified soft tissue disorders: Secondary | ICD-10-CM

## 2017-07-31 DIAGNOSIS — R569 Unspecified convulsions: Secondary | ICD-10-CM

## 2017-07-31 MED ORDER — GADOBENATE DIMEGLUMINE 529 MG/ML IV SOLN
20.0000 mL | Freq: Once | INTRAVENOUS | Status: AC | PRN
  Administered 2017-07-31: 20 mL via INTRAVENOUS

## 2017-08-01 MED FILL — DIVALPROEX SOD DR 250 MG TA: 250 | 30 days supply | Qty: 60 | Fill #1

## 2017-08-01 MED FILL — DIVALPROEX SOD DR 500 MG TA: 500 | 30 days supply | Qty: 60 | Fill #1

## 2017-08-03 ENCOUNTER — Telehealth: Payer: Self-pay | Admitting: *Deleted

## 2017-08-03 ENCOUNTER — Encounter (HOSPITAL_COMMUNITY): Payer: Medicaid Other

## 2017-08-03 ENCOUNTER — Ambulatory Visit (HOSPITAL_COMMUNITY)
Admission: RE | Admit: 2017-08-03 | Discharge: 2017-08-03 | Disposition: A | Discharge status: Home / Self Care | Payer: Self-pay | Source: Ambulatory Visit | Attending: Obstetrics and Gynecology | Admitting: Obstetrics and Gynecology

## 2017-08-03 ENCOUNTER — Ambulatory Visit
Admission: RE | Admit: 2017-08-03 | Discharge: 2017-08-03 | Disposition: A | Discharge status: Home / Self Care | Payer: No Typology Code available for payment source | Source: Ambulatory Visit | Attending: Obstetrics and Gynecology | Admitting: Obstetrics and Gynecology

## 2017-08-03 ENCOUNTER — Ambulatory Visit (HOSPITAL_COMMUNITY)
Admission: RE | Admit: 2017-08-03 | Discharge: 2017-08-03 | Disposition: A | Discharge status: Home / Self Care | Payer: Medicaid Other | Source: Ambulatory Visit | Attending: Neurology | Admitting: Neurology

## 2017-08-03 ENCOUNTER — Encounter (HOSPITAL_COMMUNITY): Payer: Self-pay

## 2017-08-03 VITALS — BP 120/78 | Ht 58.5 in

## 2017-08-03 DIAGNOSIS — Z1231 Encounter for screening mammogram for malignant neoplasm of breast: Secondary | ICD-10-CM

## 2017-08-03 DIAGNOSIS — M7989 Other specified soft tissue disorders: Secondary | ICD-10-CM | POA: Insufficient documentation

## 2017-08-03 DIAGNOSIS — Z1239 Encounter for other screening for malignant neoplasm of breast: Secondary | ICD-10-CM

## 2017-08-03 NOTE — Telephone Encounter (Signed)
-----   Message from Levert FeinsteinYijun Yan, MD sent at 08/03/2017  9:08 AM EDT ----- Please call pt for normal MRI brain.

## 2017-08-03 NOTE — Patient Instructions (Addendum)
Explained breast self awareness with Rhonda KhanElizabeth Bender. Patient did not need a Pap smear today due to last Pap smear was in March 2018 per patient. Let her know BCCCP will cover Pap smears every 3 years unless has a history of abnormal Pap smears. [facility]. Appointment scheduled for Thursday, August 03, 2017 at 1240. Let patient know the Breast Center will follow up with her within the next couple weeks with results of mammogram by letter or phone. Discussed smoking cessation with patient. Referred [facility] the [facility] and gave resources to free smoking cessation classes at [facility]. Rhonda Khanlizabeth Bender verbalized understanding.  Kamisha Ell, Kathaleen Maserhristine Poll, RN 11:36 AM

## 2017-08-03 NOTE — Progress Notes (Signed)
Right lower extremity venous duplex has been completed. Negative for DVT. Results were given to [facility] at Dr. Zannie CoveYan's office.  08/03/17 2:14 PM Olen CordialGreg Melquisedec Journey RVT

## 2017-08-03 NOTE — Telephone Encounter (Addendum)
Additionally, Tammy SoursGreg from [facility] called to report that her right lower extremity venous duplex was negative for DVT.  Attempted to reach patient by phone.  One listed number rang repeatedly with no answer or voicemail available.  The other listed number was answered as the place of business where she works but she was not in today.  I was able to contact Naida SleightMyra Wilson on her DPR - she is aware of results and will share them with the patient.  Provided our number to call back with any questions.

## 2017-08-03 NOTE — Progress Notes (Signed)
No complaints today.   Pap Smear: Pap smear not completed today. Last [place] in [place] and normal per patient. Per patient has no history of an abnormal Pap smear. No Pap smear results are in Epic.  Physical exam: Breasts Breasts symmetrical. No skin abnormalities bilateral breasts. No nipple retraction bilateral breasts. No nipple discharge bilateral breasts. No lymphadenopathy. No lumps palpated bilateral breasts. No complaints of pain or tenderness on exam. [facility] for a screening mammogram. Appointment scheduled for Thursday, August 03, 2017 at 1240.        Pelvic/Bimanual No Pap smear completed today since last Pap smear was in March 2018 per patient. Pap smear not indicated per BCCCP guidelines.   Smoking History: Patient is a former smoker that quit in October 2018. Patient stated that she still vapes. Discussed smoking cessation with patient. Referred to the [facility] and gave resources to free smoking cessation classes at [facility].  Patient Navigation: Patient education provided. Access to services provided for patient through BCCCP program.   Breast and Cervical Cancer Risk Assessment: Patient has no known family history of breast cancer, known genetic mutations, or radiation treatment to the chest before age 830. Patient has no history of cervical dysplasia, immunocompromised, or DES exposure in-utero.

## 2017-08-04 ENCOUNTER — Encounter (HOSPITAL_COMMUNITY): Payer: Self-pay | Admitting: *Deleted

## 2017-08-07 ENCOUNTER — Telehealth: Payer: Self-pay | Admitting: *Deleted

## 2017-08-07 NOTE — Telephone Encounter (Signed)
Patient aware of results.

## 2017-08-07 NOTE — Telephone Encounter (Signed)
-----   Message from Levert FeinsteinYijun Yan, MD sent at 08/07/2017 10:08 AM EDT ----- Please call patient: No evidence of right lower extremity DVT.

## 2017-08-15 ENCOUNTER — Ambulatory Visit (INDEPENDENT_AMBULATORY_CARE_PROVIDER_SITE_OTHER): Payer: Self-pay | Admitting: Neurology

## 2017-08-15 ENCOUNTER — Other Ambulatory Visit: Payer: Self-pay

## 2017-08-15 DIAGNOSIS — R569 Unspecified convulsions: Secondary | ICD-10-CM

## 2017-08-15 DIAGNOSIS — M7989 Other specified soft tissue disorders: Secondary | ICD-10-CM

## 2017-08-15 DIAGNOSIS — E8881 Metabolic syndrome: Secondary | ICD-10-CM

## 2017-08-15 DIAGNOSIS — G40401 Other generalized epilepsy and epileptic syndromes, not intractable, with status epilepticus: Secondary | ICD-10-CM

## 2017-08-16 LAB — LIPID PANEL
CHOLESTEROL TOTAL: 188 mg/dL (ref 100–199)
Chol/HDL Ratio: 3.4 ratio (ref 0.0–4.4)
HDL: 55 mg/dL (ref >39)
LDL Calculated: 120 mg/dL — ABNORMAL HIGH (ref 0–99)
Triglycerides: 65 mg/dL (ref 0–149)
VLDL CHOLESTEROL CAL: 13 mg/dL (ref 5–40)

## 2017-08-16 LAB — VALPROIC ACID LEVEL: VALPROIC ACID LVL: 4 ug/mL — AB (ref 50–100)

## 2017-08-17 ENCOUNTER — Other Ambulatory Visit: Payer: Self-pay | Admitting: Family Medicine

## 2017-08-17 ENCOUNTER — Telehealth: Payer: Self-pay

## 2017-08-17 DIAGNOSIS — G40401 Other generalized epilepsy and epileptic syndromes, not intractable, with status epilepticus: Secondary | ICD-10-CM

## 2017-08-17 NOTE — Telephone Encounter (Signed)
Please have patient come back in 1 month for recheck per provider. Thanks !

## 2017-08-17 NOTE — Telephone Encounter (Signed)
-----   Message from Massie MaroonLachina M Bender, [place]NP sent at 08/17/2017  8:36 AM EDT ----- Regarding: lab results Please call patient to inquire about whether she is taking Depakote consistently and at the same time each day. Also, remind her of the importance of taking medication consistently in order to achieve therapeutic levels.   Rhonda NationsLachina Moore Hollis  MSN, FNP-C Patient Care [facility] [address]  [place], [place] [address] [phone]

## 2017-08-17 NOTE — Telephone Encounter (Signed)
Called, no answer and no voicemail. Will try later Thanks!  

## 2017-08-18 NOTE — Procedures (Signed)
   HISTORY: 42 year old female, complains of seizure  TECHNIQUE:  16 channel EEG was performed based on standard 2410-16 international system. One channel was dedicated to EKG, which has demonstrates normal sinus rhythm of 78 beats per minutes.  Upon awakening, the posterior background activity was well-developed, in alpha range, 9 Hz, reactive to eye opening and closure.  There was no evidence of epileptiform discharge.  Photic stimulation was performed, which induced a symmetric photic driving.  Hyperventilation was not performed.  No sleep was achieved.  CONCLUSION: This is a  normal awake EEG.  There is no electrodiagnostic evidence of epileptiform discharge.  Levert FeinsteinYijun Winona Sison, M.D. Ph.D.  [facility] [address] [place], [place] [address] Phone: [phone] Fax:      [phone]

## 2017-08-18 NOTE — Telephone Encounter (Signed)
Called, no answer and no voicemail picked up.  

## 2017-08-22 NOTE — Telephone Encounter (Signed)
Called, no answer.

## 2017-08-22 NOTE — Telephone Encounter (Signed)
Unable to reach patient by phone. Will mail letter. Thanks!  

## 2017-10-04 MED FILL — DIVALPROEX SOD DR 500 MG TA: 500 | 30 days supply | Qty: 60 | Fill #2

## 2017-10-04 MED FILL — DIVALPROEX SOD DR 250 MG TA: 250 | 30 days supply | Qty: 60 | Fill #2

## 2017-10-12 ENCOUNTER — Ambulatory Visit (INDEPENDENT_AMBULATORY_CARE_PROVIDER_SITE_OTHER): Payer: Self-pay | Admitting: Neurology

## 2017-10-12 ENCOUNTER — Encounter: Payer: Self-pay | Admitting: Neurology

## 2017-10-12 VITALS — BP 123/72 | HR 70 | Wt 233.8 lb

## 2017-10-12 DIAGNOSIS — M79604 Pain in right leg: Secondary | ICD-10-CM

## 2017-10-12 DIAGNOSIS — R569 Unspecified convulsions: Secondary | ICD-10-CM

## 2017-10-12 MED ORDER — LAMOTRIGINE 25 MG PO TABS: ORAL_TABLET | ORAL | 0 refills | Status: DC

## 2017-10-12 MED ORDER — LAMOTRIGINE 100 MG PO TABS: 100.0000 mg | ORAL_TABLET | Freq: Two times a day (BID) | ORAL | 11 refills | Status: DC

## 2017-10-12 MED FILL — lamoTRIgine 100 MG TABS: 100 | 30 days supply | Qty: 60 | Fill #0

## 2017-10-12 MED FILL — lamoTRIgine 25 MG TABS: 25 | 28 days supply | Qty: 140 | Fill #0

## 2017-10-12 NOTE — Patient Instructions (Signed)
weeks Depakote   Lamotrigine   1st /500mg  1/1  2nd 0/500mg  2/2  3rd 0/0 3/3  4th 0/0 4/4    Lamotrigine  twice a day
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
## 2017-10-13 ENCOUNTER — Telehealth: Payer: Self-pay | Admitting: Neurology

## 2017-10-13 LAB — CK: Total CK: 93 U/L (ref 24–173)

## 2017-10-13 LAB — VALPROIC ACID LEVEL: Valproic Acid Lvl: 97 ug/mL (ref 50–100)

## 2017-10-13 NOTE — Telephone Encounter (Signed)
Phone call answered by a female, not pt., who sts. pt. is not available.  No detailed message left/fim

## 2017-10-13 NOTE — Telephone Encounter (Signed)
Phone rang with no answer, no ans. machine/fim 

## 2017-10-13 NOTE — Telephone Encounter (Signed)
Please call patient, CPK level was normal, there is no evidence of inflammatory muscle disease.  Depakote level was 97,  Please advise patient keep the treatment plan as we discussed during office visit

## 2017-10-17 NOTE — Telephone Encounter (Signed)
Spoke with Lanora Manis and reviewed below lab results.  She verbalized understanding of same/fim

## 2017-11-15 ENCOUNTER — Encounter: Payer: Self-pay | Admitting: Family Medicine

## 2017-11-15 ENCOUNTER — Ambulatory Visit (INDEPENDENT_AMBULATORY_CARE_PROVIDER_SITE_OTHER): Payer: Self-pay | Admitting: Family Medicine

## 2017-11-15 VITALS — BP 138/69 | HR 98 | Temp 97.9°F | Resp 16 | Ht 63.0 in | Wt 239.0 lb

## 2017-11-15 DIAGNOSIS — E559 Vitamin D deficiency, unspecified: Secondary | ICD-10-CM

## 2017-11-15 DIAGNOSIS — Z8669 Personal history of other diseases of the nervous system and sense organs: Secondary | ICD-10-CM

## 2017-11-15 NOTE — Patient Instructions (Signed)
  We will follow-up by phone with any abnormal laboratory results.  Follow-up in 6 months for complete physical exam.   keep up the good work. Vitamin D Deficiency Vitamin D deficiency is when your body does not have enough vitamin D. Vitamin D is important because:  It helps your body use other minerals that your body needs.  It helps keep your bones strong and healthy.  It may help to prevent some diseases.  It helps your heart and other muscles work well.  You can get vitamin D by:  Eating foods with vitamin D in them.  Drinking or eating milk or other foods that have had vitamin D added to them.  Taking a vitamin D supplement.  Being in the sun.  Not getting enough vitamin D can make your bones become soft. It can also cause other health problems. Follow these instructions at home:  Take medicines and supplements only as told by your doctor.  Eat foods that have vitamin D. These include: ? Dairy products, cereals, or juices with added vitamin D. Check the label for vitamin D. ? Fatty fish like salmon or trout. ? Eggs. ? Oysters.  Do not use tanning beds.  Stay at a healthy weight. Lose weight, if needed.  Keep all follow-up visits as told by your doctor. This is important. Contact a doctor if:  Your symptoms do not go away.  You feel sick to your stomach (nauseous).  Youthrow up (vomit).  You poop less often than usual or you have trouble pooping (constipation). This information is not intended to replace advice given to you by your health care provider. Make sure you discuss any questions you have with your health care provider. Document Released: 04/28/2011 Document Revised: 10/15/2015 Document Reviewed: 09/24/2014 Elsevier Interactive Patient Education  [employer]2018 Elsevier Inc.

## 2017-11-16 ENCOUNTER — Telehealth: Payer: Self-pay

## 2017-11-16 ENCOUNTER — Other Ambulatory Visit: Payer: Self-pay

## 2017-11-16 DIAGNOSIS — E559 Vitamin D deficiency, unspecified: Secondary | ICD-10-CM

## 2017-11-16 LAB — VITAMIN D 25 HYDROXY (VIT D DEFICIENCY, FRACTURES): VIT D 25 HYDROXY: 28.1 ng/mL — AB (ref 30.0–100.0)

## 2017-11-16 MED ORDER — ERGOCALCIFEROL 1.25 MG (50000 UT) PO CAPS: 50000.0000 [IU] | ORAL_CAPSULE | ORAL | 0 refills | Status: DC

## 2017-11-16 MED FILL — VIT D2 1.25 MG (50,000 UNIT: 1.25 MG | 30 days supply | Qty: 4 | Fill #0

## 2017-11-16 NOTE — Telephone Encounter (Signed)
Called and spoke with patient, Advised that vitamin D was low and she should continue taking vitamin D once weekly as prescribed. Asked that she keep next scheduled follow up and we will recheck at that time. Thanks!

## 2017-11-16 NOTE — Telephone Encounter (Signed)
-----   Message from Massie MaroonLachina M Hollis, [place]NP sent at 11/16/2017  5:54 AM EDT ----- Regarding: lab results Please Inform patient that vitamin D level is decreased, which is consistent with vitamin d deficiency. Will continue drisdol 50,000 IU weekly. Will recheck at scheduled follow up.    Nolon NationsLachina Moore Hollis  MSN, FNP-C Patient Care [facility] [address]  [place], [place] [address] [phone]
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
## 2017-11-22 ENCOUNTER — Ambulatory Visit: Payer: Self-pay | Attending: Family Medicine

## 2017-12-11 MED FILL — lamoTRIgine 100 MG TABS: 100 | 30 days supply | Qty: 60 | Fill #1

## 2018-01-10 MED FILL — lamoTRIgine 100 MG TABS: 100 | 30 days supply | Qty: 60 | Fill #2

## 2018-01-16 ENCOUNTER — Ambulatory Visit: Payer: Self-pay | Attending: Family Medicine

## 2018-01-16 ENCOUNTER — Emergency Department (HOSPITAL_COMMUNITY)
Admission: EM | Admit: 2018-01-16 | Discharge: 2018-01-16 | Disposition: A | Discharge status: Home / Self Care | Payer: No Typology Code available for payment source | Attending: Emergency Medicine | Admitting: Emergency Medicine

## 2018-01-16 ENCOUNTER — Emergency Department (HOSPITAL_COMMUNITY): Payer: No Typology Code available for payment source

## 2018-01-16 ENCOUNTER — Encounter (HOSPITAL_COMMUNITY): Payer: Self-pay

## 2018-01-16 DIAGNOSIS — J069 Acute upper respiratory infection, unspecified: Secondary | ICD-10-CM | POA: Insufficient documentation

## 2018-01-16 DIAGNOSIS — Z79899 Other long term (current) drug therapy: Secondary | ICD-10-CM | POA: Insufficient documentation

## 2018-01-16 DIAGNOSIS — B9789 Other viral agents as the cause of diseases classified elsewhere: Secondary | ICD-10-CM

## 2018-01-16 DIAGNOSIS — Z87891 Personal history of nicotine dependence: Secondary | ICD-10-CM | POA: Insufficient documentation

## 2018-01-16 MED ORDER — ALBUTEROL SULFATE (2.5 MG/3ML) 0.083% IN NEBU
5.0000 mg | INHALATION_SOLUTION | Freq: Once | RESPIRATORY_TRACT | Status: AC
  Administered 2018-01-16: 5 mg via RESPIRATORY_TRACT
  Filled 2018-01-16: qty 6

## 2018-01-16 MED ORDER — ALBUTEROL SULFATE HFA 108 (90 BASE) MCG/ACT IN AERS
1.0000 | INHALATION_SPRAY | Freq: Once | RESPIRATORY_TRACT | Status: AC
  Administered 2018-01-16: 1 via RESPIRATORY_TRACT
  Filled 2018-01-16: qty 6.7

## 2018-01-16 MED ORDER — HYDROXYZINE HCL 25 MG PO TABS
25.0000 mg | ORAL_TABLET | Freq: Once | ORAL | Status: AC
  Administered 2018-01-16: 25 mg via ORAL
  Filled 2018-01-16: qty 1

## 2018-01-16 MED ORDER — ALBUTEROL SULFATE (2.5 MG/3ML) 0.083% IN NEBU
2.5000 mg | INHALATION_SOLUTION | Freq: Once | RESPIRATORY_TRACT | Status: AC
  Administered 2018-01-16: 2.5 mg via RESPIRATORY_TRACT
  Filled 2018-01-16: qty 3

## 2018-01-16 MED ORDER — IPRATROPIUM-ALBUTEROL 0.5-2.5 (3) MG/3ML IN SOLN
3.0000 mL | Freq: Once | RESPIRATORY_TRACT | Status: AC
  Administered 2018-01-16: 3 mL via RESPIRATORY_TRACT
  Filled 2018-01-16: qty 3

## 2018-01-16 NOTE — Discharge Instructions (Signed)
These use the albuterol inhaler 1-2 times every 4-6 hours as needed for cough or shortness of breath.  Please follow-up with your primary care doctor next week as already scheduled.  Return to the ER if you have any new symptoms or worsening of your symptoms including fevers, chills, worsening cough, shortness of breath, chest pain or pain with breathing.

## 2018-01-16 NOTE — ED Triage Notes (Signed)
Pt reports a burning sensation with coughing.
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
## 2018-01-16 NOTE — ED Triage Notes (Signed)
Pt reports that she has has a productive yellow sputum  coughing and has become more SOB with minimal exertion and at rest  x 1 week. Pt reports that she stopped smoking the past week. Pt is talking in full sentences and is breathing unlabored.

## 2018-01-18 ENCOUNTER — Ambulatory Visit: Payer: No Typology Code available for payment source | Admitting: Family Medicine

## 2018-01-24 ENCOUNTER — Ambulatory Visit (INDEPENDENT_AMBULATORY_CARE_PROVIDER_SITE_OTHER): Payer: No Typology Code available for payment source | Admitting: Family Medicine

## 2018-01-24 ENCOUNTER — Encounter: Payer: Self-pay | Admitting: Family Medicine

## 2018-01-24 VITALS — BP 141/69 | HR 83 | Temp 98.2°F | Resp 16 | Ht 59.0 in | Wt 236.0 lb

## 2018-01-24 DIAGNOSIS — J40 Bronchitis, not specified as acute or chronic: Secondary | ICD-10-CM

## 2018-01-24 MED ORDER — AZITHROMYCIN 250 MG PO TABS: ORAL_TABLET | ORAL | 0 refills | Status: DC

## 2018-01-24 MED ORDER — PREDNISONE 20 MG PO TABS: 40.0000 mg | ORAL_TABLET | Freq: Every day | ORAL | 0 refills | Status: AC

## 2018-01-24 MED FILL — AZITHROMYCIN 250 MG TABLET: 250 | 5 days supply | Qty: 6 | Fill #0

## 2018-01-24 MED FILL — predniSONE 20 MG TABS: 20 | 2 days supply | Qty: 4 | Fill #0

## 2018-01-24 NOTE — Patient Instructions (Signed)
Azithromycin tablets What is this medicine? AZITHROMYCIN (az ith roe MYE sin) is a macrolide antibiotic. It is used to treat or prevent certain kinds of bacterial infections. It will not work for colds, flu, or other viral infections. This medicine may be used for other purposes; ask your health care provider or pharmacist if you have questions. COMMON BRAND NAME(S): Zithromax, Zithromax Tri-Pak, Zithromax Z-Pak What should I tell my health care provider before I take this medicine? They need to know if you have any of these conditions: -kidney disease -liver disease -irregular heartbeat or heart disease -an unusual or allergic reaction to azithromycin, erythromycin, other macrolide antibiotics, foods, dyes, or preservatives -pregnant or trying to get pregnant -breast-feeding How should I use this medicine? Take this medicine by mouth with a full glass of water. Follow the directions on the prescription label. The tablets can be taken with food or on an empty stomach. If the medicine upsets your stomach, take it with food. Take your medicine at regular intervals. Do not take your medicine more often than directed. Take all of your medicine as directed even if you think your are better. Do not skip doses or stop your medicine early. Talk to your pediatrician regarding the use of this medicine in children. While this drug may be prescribed for children as young as 6 months for selected conditions, precautions do apply. Overdosage: If you think you have taken too much of this medicine contact a poison control center or emergency room at once. NOTE: This medicine is only for you. Do not share this medicine with others. What if I miss a dose? If you miss a dose, take it as soon as you can. If it is almost time for your next dose, take only that dose. Do not take double or extra doses. What may interact with this medicine? Do not take this medicine with any of the following  medications: -lincomycin This medicine may also interact with the following medications: -amiodarone -antacids -birth control pills -cyclosporine -digoxin -magnesium -nelfinavir -phenytoin -warfarin This list may not describe all possible interactions. Give your health care provider a list of all the medicines, herbs, non-prescription drugs, or dietary supplements you use. Also tell them if you smoke, drink alcohol, or use illegal drugs. Some items may interact with your medicine. What should I watch for while using this medicine? Tell your doctor or healthcare professional if your symptoms do not start to get better or if they get worse. Do not treat diarrhea with over the counter products. Contact your doctor if you have diarrhea that lasts more than 2 days or if it is severe and watery. This medicine can make you more sensitive to the sun. Keep out of the sun. If you cannot avoid being in the sun, wear protective clothing and use sunscreen. Do not use sun lamps or tanning beds/booths. What side effects may I notice from receiving this medicine? Side effects that you should report to your doctor or health care professional as soon as possible: -allergic reactions like skin rash, itching or hives, swelling of the face, lips, or tongue -confusion, nightmares or hallucinations -dark urine -difficulty breathing -hearing loss -irregular heartbeat or chest pain -pain or difficulty passing urine -redness, blistering, peeling or loosening of the skin, including inside the mouth -white patches or sores in the mouth -yellowing of the eyes or skin Side effects that usually do not require medical attention (report to your doctor or health care professional if they continue or are bothersome): -  diarrhea -dizziness, drowsiness -headache -stomach upset or vomiting -tooth discoloration -vaginal irritation This list may not describe all possible side effects. Call your doctor for medical advice  about side effects. You may report side effects to FDA at 1-800-FDA-1088. Where should I keep my medicine? Keep out of the reach of children. Store at room temperature between 15 and 30 degrees C (59 and 86 degrees F). Throw away any unused medicine after the expiration date. NOTE: This sheet is a summary. It may not cover all possible information. If you have questions about this medicine, talk to your doctor, pharmacist, or health care provider.  2018 Elsevier/Gold Standard (2015-07-07 15:26:03) Prednisone tablets What is this medicine? PREDNISONE (PRED ni sone) is a corticosteroid. It is commonly used to treat inflammation of the skin, joints, lungs, and other organs. Common conditions treated include asthma, allergies, and arthritis. It is also used for other conditions, such as blood disorders and diseases of the adrenal glands. This medicine may be used for other purposes; ask your health care provider or pharmacist if you have questions. COMMON BRAND NAME(S): Deltasone, Predone, Sterapred, Sterapred DS What should I tell my health care provider before I take this medicine? They need to know if you have any of these conditions: -Cushing's syndrome -diabetes -glaucoma -heart disease -high blood pressure -infection (especially a virus infection such as chickenpox, cold sores, or herpes) -kidney disease -liver disease -mental illness -myasthenia gravis -osteoporosis -seizures -stomach or intestine problems -thyroid disease -an unusual or allergic reaction to lactose, prednisone, other medicines, foods, dyes, or preservatives -pregnant or trying to get pregnant -breast-feeding How should I use this medicine? Take this medicine by mouth with a glass of water. Follow the directions on the prescription label. Take this medicine with food. If you are taking this medicine once a day, take it in the morning. Do not take more medicine than you are told to take. Do not suddenly stop taking  your medicine because you may develop a severe reaction. Your doctor will tell you how much medicine to take. If your doctor wants you to stop the medicine, the dose may be slowly lowered over time to avoid any side effects. Talk to your pediatrician regarding the use of this medicine in children. Special care may be needed. Overdosage: If you think you have taken too much of this medicine contact a poison control center or emergency room at once. NOTE: This medicine is only for you. Do not share this medicine with others. What if I miss a dose? If you miss a dose, take it as soon as you can. If it is almost time for your next dose, talk to your doctor or health care professional. You may need to miss a dose or take an extra dose. Do not take double or extra doses without advice. What may interact with this medicine? Do not take this medicine with any of the following medications: -metyrapone -mifepristone This medicine may also interact with the following medications: -aminoglutethimide -amphotericin B -aspirin and aspirin-like medicines -barbiturates -certain medicines for diabetes, like glipizide or glyburide -cholestyramine -cholinesterase inhibitors -cyclosporine -digoxin -diuretics -ephedrine -female hormones, like estrogens and birth control pills -isoniazid -ketoconazole -NSAIDS, medicines for pain and inflammation, like ibuprofen or naproxen -phenytoin -rifampin -toxoids -vaccines -warfarin This list may not describe all possible interactions. Give your health care provider a list of all the medicines, herbs, non-prescription drugs, or dietary supplements you use. Also tell them if you smoke, drink alcohol, or use illegal drugs. Some items   may interact with your medicine. What should I watch for while using this medicine? Visit your doctor or health care professional for regular checks on your progress. If you are taking this medicine over a prolonged period, carry an  identification card with your name and address, the type and dose of your medicine, and your doctor's name and address. This medicine may increase your risk of getting an infection. Tell your doctor or health care professional if you are around anyone with measles or chickenpox, or if you develop sores or blisters that do not heal properly. If you are going to have surgery, tell your doctor or health care professional that you have taken this medicine within the last twelve months. Ask your doctor or health care professional about your diet. You may need to lower the amount of salt you eat. This medicine may affect blood sugar levels. If you have diabetes, check with your doctor or health care professional before you change your diet or the dose of your diabetic medicine. What side effects may I notice from receiving this medicine? Side effects that you should report to your doctor or health care professional as soon as possible: -allergic reactions like skin rash, itching or hives, swelling of the face, lips, or tongue -changes in emotions or moods -changes in vision -depressed mood -eye pain -fever or chills, cough, sore throat, pain or difficulty passing urine -increased thirst -swelling of ankles, feet Side effects that usually do not require medical attention (report to your doctor or health care professional if they continue or are bothersome): -confusion, excitement, restlessness -headache -nausea, vomiting -skin problems, acne, thin and shiny skin -trouble sleeping -weight gain This list may not describe all possible side effects. Call your doctor for medical advice about side effects. You may report side effects to FDA at 1-800-FDA-1088. Where should I keep my medicine? Keep out of the reach of children. Store at room temperature between 15 and 30 degrees C (59 and 86 degrees F). Protect from light. Keep container tightly closed. Throw away any unused medicine after the expiration  date. NOTE: This sheet is a summary. It may not cover all possible information. If you have questions about this medicine, talk to your doctor, pharmacist, or health care provider.  2018 Elsevier/Gold Standard (2010-12-23 10:57:14) Acute Bronchitis, Adult Acute bronchitis is when air tubes (bronchi) in the lungs suddenly get swollen. The condition can make it hard to breathe. It can also cause these symptoms:  A cough.  Coughing up clear, yellow, or green mucus.  Wheezing.  Chest congestion.  Shortness of breath.  A fever.  Body aches.  Chills.  A sore throat.  Follow these instructions at home: Medicines  Take over-the-counter and prescription medicines only as told by your doctor.  If you were prescribed an antibiotic medicine, take it as told by your doctor. Do not stop taking the antibiotic even if you start to feel better. General instructions  Rest.  Drink enough fluids to keep your pee (urine) clear or pale yellow.  Avoid smoking and secondhand smoke. If you smoke and you need help quitting, ask your doctor. Quitting will help your lungs heal faster.  Use an inhaler, cool mist vaporizer, or humidifier as told by your doctor.  Keep all follow-up visits as told by your doctor. This is important. How is this prevented? To lower your risk of getting this condition again:  Wash your hands often with soap and water. If you cannot use soap and water, use   hand sanitizer.  Avoid contact with people who have cold symptoms.  Try not to touch your hands to your mouth, nose, or eyes.  Make sure to get the flu shot every year.  Contact a doctor if:  Your symptoms do not get better in 2 weeks. Get help right away if:  You cough up blood.  You have chest pain.  You have very bad shortness of breath.  You become dehydrated.  You faint (pass out) or keep feeling like you are going to pass out.  You keep throwing up (vomiting).  You have a very bad  headache.  Your fever or chills gets worse. This information is not intended to replace advice given to you by your health care provider. Make sure you discuss any questions you have with your health care provider. Document Released: 10/26/2007 Document Revised: 12/16/2015 Document Reviewed: 10/28/2015 Elsevier Interactive Patient Education  2018 Elsevier Inc.  

## 2018-01-24 NOTE — Progress Notes (Signed)
  [facility]   Progress Note: General Provider: Mike Gip, FNP  SUBJECTIVE:   Rhonda Bender is a 42 y.o. 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 Patient presents today for Follow-up (er follow up for bronchitis )  Patient was seen in the ED on 01/16/2018 for Viral URI. Was given a duoneb with relief. Patient states that she is in a recovery program for drug abuse Review of Systems  Constitutional: Negative.   HENT: Negative.   Eyes: Negative.   Respiratory: Positive for cough, shortness of breath and wheezing.   Cardiovascular: Negative.   Gastrointestinal: Negative.   Genitourinary: Negative.   Musculoskeletal: Negative.   Skin: Negative.   Neurological: Negative.   Psychiatric/Behavioral: Negative.      OBJECTIVE: BP (!) 141/69 (BP Location: Right Arm, Patient Position: Sitting, Cuff Size: Normal)   Pulse 83   Temp 98.2 F (36.8 C) (Oral)   Resp 16   Ht 4\' 11"  (1.499 m)   Wt 236 lb (107 kg)   LMP 01/03/2018   SpO2 98%   BMI 47.67 kg/m   Physical Exam  Constitutional: She is oriented to person, place, and time. She appears well-developed and well-nourished. No distress.  HENT:  Head: Normocephalic and atraumatic.  Eyes: Pupils are equal, round, and reactive to light. Conjunctivae and EOM are normal.  Neck: Normal range of motion.  Cardiovascular: Normal rate, regular rhythm, normal heart sounds and intact distal pulses.  Pulmonary/Chest: No respiratory distress. She has decreased breath sounds in the right lower field and the left lower field.  Musculoskeletal: Normal range of motion.  Neurological: She is alert and oriented to person, place, and time.  Skin: Skin is warm and dry.  Psychiatric: She has a normal mood and affect. Her behavior is normal. Thought content normal.  Nursing note and vitals  reviewed.   ASSESSMENT/PLAN:   1. Bronchitis Praised for recovery and smoking cessation.  - azithromycin (ZITHROMAX) 250 MG tablet; Take 2 pills po x 1 day and then 1 pill daily x 5 days.  Dispense: 6 tablet; Refill: 0 - predniSONE (DELTASONE) 20 MG tablet; Take 2 tablets (40 mg total) by mouth daily with breakfast for 2 days.  Dispense: 4 tablet; Refill: 0        The patient was given clear instructions to go to ER or return to medical center if symptoms do not improve, worsen or new problems develop. The patient verbalized understanding and agreed with plan of care.   Rhonda Bender. Riley Lam, FNP-BC [facility] [address] [place], [place] [address] [phone]     This note has been created with Dragon speech recognition software and smart phrase technology. Any transcriptional errors are unintentional.

## 2018-04-25 ENCOUNTER — Ambulatory Visit (INDEPENDENT_AMBULATORY_CARE_PROVIDER_SITE_OTHER): Payer: Self-pay | Admitting: Family Medicine

## 2018-04-25 ENCOUNTER — Encounter: Payer: Self-pay | Admitting: Family Medicine

## 2018-04-25 VITALS — BP 126/80 | HR 82 | Temp 98.0°F | Resp 16 | Ht 59.0 in | Wt 232.0 lb

## 2018-04-25 DIAGNOSIS — J40 Bronchitis, not specified as acute or chronic: Secondary | ICD-10-CM

## 2018-04-25 MED ORDER — LEVOCETIRIZINE DIHYDROCHLORIDE 5 MG PO TABS: 5.0000 mg | ORAL_TABLET | Freq: Every evening | ORAL | 0 refills | Status: DC

## 2018-04-25 MED ORDER — GUAIFENESIN ER 600 MG PO TB12: 600.0000 mg | ORAL_TABLET | Freq: Two times a day (BID) | ORAL | 0 refills | Status: AC

## 2018-04-25 MED ORDER — ALBUTEROL SULFATE (2.5 MG/3ML) 0.083% IN NEBU
2.5000 mg | INHALATION_SOLUTION | Freq: Once | RESPIRATORY_TRACT | Status: AC
Start: 2018-04-25 — End: 2018-04-25
  Administered 2018-04-25: 2.5 mg via RESPIRATORY_TRACT

## 2018-04-25 MED ORDER — ALBUTEROL SULFATE HFA 108 (90 BASE) MCG/ACT IN AERS: 2.0000 | INHALATION_SPRAY | Freq: Four times a day (QID) | RESPIRATORY_TRACT | 2 refills | Status: DC | PRN

## 2018-04-25 MED ORDER — IPRATROPIUM BROMIDE 0.02 % IN SOLN
0.5000 mg | Freq: Once | RESPIRATORY_TRACT | Status: AC
  Administered 2018-04-25: 0.5 mg via RESPIRATORY_TRACT

## 2018-04-25 MED ORDER — PREDNISONE 20 MG PO TABS: 60.0000 mg | ORAL_TABLET | Freq: Every day | ORAL | 0 refills | Status: AC

## 2018-04-25 MED FILL — !VENTOLIN HFA INHALER: 108 (90 BAS | 25 days supply | Qty: 18 | Fill #0

## 2018-04-25 MED FILL — predniSONE 20 MG TABS: 20 | 3 days supply | Qty: 9 | Fill #0

## 2018-04-25 NOTE — Progress Notes (Signed)
[facility]   Progress Note: Sick Visit Provider: Mike Gip, FNP  SUBJECTIVE:   Monda Chastain is a 42 y.o. 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 Patient presents today for Cough (green mucus ) Cough  This is a new problem. The current episode started 1 to 4 weeks ago. The problem has been gradually worsening. The problem occurs every few minutes. The cough is productive of sputum. Associated symptoms include nasal congestion, postnasal drip and shortness of breath. Pertinent negatives include no sore throat.    Review of Systems  Constitutional: Negative.   HENT: Positive for postnasal drip. Negative for sore throat.   Eyes: Negative.   Respiratory: Positive for cough and shortness of breath.   Cardiovascular: Negative.   Gastrointestinal: Negative.   Genitourinary: Negative.   Musculoskeletal: Negative.   Skin: Negative.   Neurological: Negative.   Psychiatric/Behavioral: Negative.      OBJECTIVE: BP 126/80 (BP Location: Left Arm, Patient Position: Sitting, Cuff Size: Large)   Pulse 82   Temp 98 F (36.7 C) (Oral)   Resp 16   Ht 4\' 11"  (1.499 m)   Wt 232 lb (105.2 kg)   LMP 04/11/2018   SpO2 100%   BMI 46.86 kg/m   Wt Readings from Last 3 Encounters:  04/25/18 232 lb (105.2 kg)  01/24/18 236 lb (107 kg)  01/16/18 230 lb (104.3 kg)     Physical Exam Vitals signs and nursing note reviewed.  Constitutional:      General: She is not in acute distress.    Appearance: She is well-developed.  HENT:     Head: Normocephalic and atraumatic.  Eyes:     Conjunctiva/sclera: Conjunctivae normal.     Pupils: Pupils are equal, round, and reactive to light.  Neck:     Musculoskeletal: Normal range of motion.  Cardiovascular:     Rate and Rhythm: Normal rate and regular rhythm.     Heart sounds: Normal heart sounds.  Pulmonary:   Effort: Pulmonary effort is normal. No respiratory distress.     Breath sounds: Wheezing present.  Abdominal:     General: Bowel sounds are normal. There is no distension.     Palpations: Abdomen is soft.  Musculoskeletal: Normal range of motion.  Skin:    General: Skin is warm and dry.  Neurological:     Mental Status: She is alert and oriented to person, place, and time.  Psychiatric:        Behavior: Behavior normal.        Thought Content: Thought content normal.     ASSESSMENT/PLAN:   1. Bronchitis - albuterol (PROVENTIL) (2.5 MG/3ML) 0.083% nebulizer solution 2.5 mg - ipratropium (ATROVENT) nebulizer solution 0.5 mg - predniSONE (DELTASONE) 20 MG tablet; Take 3 tablets (60 mg total) by mouth daily with breakfast for 3 days.  Dispense: 9 tablet; Refill: 0 - guaiFENesin (MUCINEX) 600 MG 12 hr tablet; Take 1 tablet (600 mg total) by mouth 2 (two) times daily for 7 days.  Dispense: 14 tablet; Refill: 0 - albuterol (PROVENTIL HFA;VENTOLIN HFA) 108 (90 Base) MCG/ACT inhaler; Inhale 2 puffs into the lungs every 6 (six) hours as needed for wheezing or shortness of breath.  Dispense: 1 Inhaler; Refill: 2       The patient was given clear instructions to go to ER or return to medical center if symptoms do not improve, worsen  or new problems develop. The patient verbalized understanding and agreed with plan of care.   Ms. Freda Jacksonndr L. Riley Lamouglas, FNP-BC [facility] [address]  [place], [place] [id number] [phone]     This note has been created with Dragon speech recognition software and smart phrase technology. Any transcriptional errors are unintentional.

## 2018-04-25 NOTE — Patient Instructions (Signed)
Congratulations on graduating!!!   Acute Bronchitis, Adult Acute bronchitis is when air tubes (bronchi) in the lungs suddenly get swollen. The condition can make it hard to breathe. It can also cause these symptoms:  A cough.  Coughing up clear, yellow, or green mucus.  Wheezing.  Chest congestion.  Shortness of breath.  A fever.  Body aches.  Chills.  A sore throat.  Follow these instructions at home: Medicines  Take over-the-counter and prescription medicines only as told by your doctor.  If you were prescribed an antibiotic medicine, take it as told by your doctor. Do not stop taking the antibiotic even if you start to feel better. General instructions  Rest.  Drink enough fluids to keep your pee (urine) clear or pale yellow.  Avoid smoking and secondhand smoke. If you smoke and you need help quitting, ask your doctor. Quitting will help your lungs heal faster.  Use an inhaler, cool mist vaporizer, or humidifier as told by your doctor.  Keep all follow-up visits as told by your doctor. This is important. How is this prevented? To lower your risk of getting this condition again:  Wash your hands often with soap and water. If you cannot use soap and water, use hand sanitizer.  Avoid contact with people who have cold symptoms.  Try not to touch your hands to your mouth, nose, or eyes.  Make sure to get the flu shot every year.  Contact a doctor if:  Your symptoms do not get better in 2 weeks. Get help right away if:  You cough up blood.  You have chest pain.  You have very bad shortness of breath.  You become dehydrated.  You faint (pass out) or keep feeling like you are going to pass out.  You keep throwing up (vomiting).  You have a very bad headache.  Your fever or chills gets worse. This information is not intended to replace advice given to you by your health care provider. Make sure you discuss any questions you have with your health care  provider. Document Released: 10/26/2007 Document Revised: 12/16/2015 Document Reviewed: 10/28/2015 Elsevier Interactive Patient Education  [employer]2018 Elsevier Inc.
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
## 2018-05-01 ENCOUNTER — Ambulatory Visit: Payer: Medicaid Other | Admitting: Nurse Practitioner

## 2018-05-02 ENCOUNTER — Encounter: Payer: Self-pay | Admitting: Nurse Practitioner

## 2018-05-21 ENCOUNTER — Ambulatory Visit: Payer: No Typology Code available for payment source | Admitting: Family Medicine

## 2018-06-27 ENCOUNTER — Encounter: Payer: Self-pay | Admitting: Family Medicine

## 2018-06-27 ENCOUNTER — Ambulatory Visit (INDEPENDENT_AMBULATORY_CARE_PROVIDER_SITE_OTHER): Payer: Self-pay | Admitting: Family Medicine

## 2018-06-27 VITALS — BP 118/62 | HR 84 | Temp 97.8°F | Resp 18 | Ht 59.0 in | Wt 228.0 lb

## 2018-06-27 DIAGNOSIS — J069 Acute upper respiratory infection, unspecified: Secondary | ICD-10-CM

## 2018-06-27 DIAGNOSIS — R059 Cough, unspecified: Secondary | ICD-10-CM

## 2018-06-27 DIAGNOSIS — B9789 Other viral agents as the cause of diseases classified elsewhere: Secondary | ICD-10-CM

## 2018-06-27 DIAGNOSIS — R05 Cough: Secondary | ICD-10-CM

## 2018-06-27 LAB — POC INFLUENZA A&B (BINAX/QUICKVUE)
Influenza A, POC: NEGATIVE
Influenza B, POC: NEGATIVE

## 2018-06-27 MED ORDER — ONDANSETRON HCL 4 MG PO TABS: 4.0000 mg | ORAL_TABLET | Freq: Three times a day (TID) | ORAL | 0 refills | Status: DC | PRN

## 2018-06-27 MED ORDER — LEVOCETIRIZINE DIHYDROCHLORIDE 5 MG PO TABS: 5.0000 mg | ORAL_TABLET | Freq: Every evening | ORAL | 0 refills | Status: DC

## 2018-06-27 MED ORDER — FLUTICASONE PROPIONATE 50 MCG/ACT NA SUSP: 2.0000 | Freq: Every day | NASAL | 6 refills | Status: DC

## 2018-06-27 MED FILL — FLUTICASONE PROP 50 MCG SPR: 50 | 30 days supply | Qty: 16 | Fill #0

## 2018-06-27 MED FILL — ONDANSETRON HCL 4 MG TABLET: 4 | 7 days supply | Qty: 20 | Fill #0

## 2018-06-27 MED FILL — LEVOCETIRIZINE 5 MG TABLET: 5 | 30 days supply | Qty: 30 | Fill #0

## 2018-06-27 NOTE — Patient Instructions (Signed)
Comfort measures to help with symptoms of Viral URI.  Increase fluids and rest. Tylenol or Motrin for fever, chills, and body aches (if not contraindicated). Nasal irrigation, humidifier, lozenges, vapor rub, warm fluids (soups, herbal teas with honey and lemon) and things that make you feel better.      Viral Respiratory Infection A viral respiratory infection is an illness that affects parts of the body that are used for breathing. These include the lungs, nose, and throat. It is caused by a germ called a virus. Some examples of this kind of infection are:  A cold.  The flu (influenza).  A respiratory syncytial virus (RSV) infection. A person who gets this illness may have the following symptoms:  A stuffy or runny nose.  Yellow or green fluid in the nose.  A cough.  Sneezing.  Tiredness (fatigue).  Achy muscles.  A sore throat.  Sweating or chills.  A fever.  A headache. Follow these instructions at home: Managing pain and congestion  Take over-the-counter and prescription medicines only as told by your doctor.  If you have a sore throat, gargle with salt water. Do this 3-4 times per day or as needed. To make a salt-water mixture, dissolve -1 tsp of salt in 1 cup of warm water. Make sure that all the salt dissolves.  Use nose drops made from salt water. This helps with stuffiness (congestion). It also helps soften the skin around your nose.  Drink enough fluid to keep your pee (urine) pale yellow. General instructions   Rest as much as possible.  Do not drink alcohol.  Do not use any products that have nicotine or tobacco, such as cigarettes and e-cigarettes. If you need help quitting, ask your doctor.  Keep all follow-up visits as told by your doctor. This is important. How is this prevented?   Get a flu shot every year. Ask your doctor when you should get your flu shot.  Do not let other people get your germs. If you are sick: ? Stay home from work  or school. ? Wash your hands with soap and water often. Wash your hands after you cough or sneeze. If soap and water are not available, use hand sanitizer.  Avoid contact with people who are sick during cold and flu season. This is in fall and winter. Get help if:  Your symptoms last for 10 days or longer.  Your symptoms get worse over time.  You have a fever.  You have very bad pain in your face or forehead.  Parts of your jaw or neck become very swollen. Get help right away if:  You feel pain or pressure in your chest.  You have shortness of breath.  You faint or feel like you will faint.  You keep throwing up (vomiting).  You feel confused. Summary  A viral respiratory infection is an illness that affects parts of the body that are used for breathing.  Examples of this illness include a cold, the flu, and respiratory syncytial virus (RSV) infection.  The infection can cause a runny nose, cough, sneezing, sore throat, and fever.  Follow what your doctor tells you about taking medicines, drinking lots of fluid, washing your hands, resting at home, and avoiding people who are sick. This information is not intended to replace advice given to you by your health care provider. Make sure you discuss any questions you have with your health care provider. Document Released: 04/21/2008 Document Revised: 06/19/2017 Document Reviewed: 06/19/2017 Elsevier Interactive Patient   2019 [employer].
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
## 2018-07-26 ENCOUNTER — Ambulatory Visit: Payer: No Typology Code available for payment source | Admitting: Family Medicine

## 2018-10-22 ENCOUNTER — Ambulatory Visit (INDEPENDENT_AMBULATORY_CARE_PROVIDER_SITE_OTHER): Payer: Self-pay | Admitting: Family Medicine

## 2018-10-22 ENCOUNTER — Other Ambulatory Visit: Payer: Self-pay

## 2018-10-22 ENCOUNTER — Encounter: Payer: Self-pay | Admitting: Family Medicine

## 2018-10-22 VITALS — BP 133/74 | HR 76 | Temp 98.2°F | Resp 14 | Ht 59.0 in | Wt 236.0 lb

## 2018-10-22 DIAGNOSIS — H6690 Otitis media, unspecified, unspecified ear: Secondary | ICD-10-CM

## 2018-10-22 MED ORDER — AMOXICILLIN-POT CLAVULANATE 875-125 MG PO TABS: 1.0000 | ORAL_TABLET | Freq: Two times a day (BID) | ORAL | 0 refills | Status: AC

## 2018-10-22 MED FILL — AMOX-CLAV 875-125 MG TABLET: 875-125 | 7 days supply | Qty: 14 | Fill #0

## 2018-10-22 NOTE — Progress Notes (Signed)
  [facility]   Progress Note: Sick Visit Provider: Mike Gip, FNP  SUBJECTIVE:   Rhonda Bender is a 43 y.o. female who  has a past medical history of Addiction to drug [facility]), Heart disease, Schizophrenia (HCC), and Seizures (HCC).. Patient presents today for Ear Pain (LEFT EAR PAIN X 5 DAYS ) Otalgia   There is pain in the left ear. This is a new problem. The current episode started in the past 7 days. The problem occurs constantly. The problem has been gradually worsening. There has been no fever. The pain is at a severity of 6/10. Pertinent negatives include no rhinorrhea or sore throat. She has tried ear drops for the symptoms. The treatment provided no relief.    Review of Systems  HENT: Positive for ear pain. Negative for rhinorrhea and sore throat.   All other systems reviewed and are negative.    OBJECTIVE: BP 133/74 (BP Location: Right Arm, Patient Position: Sitting, Cuff Size: Normal)   Pulse 76   Temp 98.2 F (36.8 C) (Oral)   Resp 14   Ht 4\' 11"  (1.499 m)   Wt 236 lb (107 kg)   LMP 10/11/2018   SpO2 100%   BMI 47.67 kg/m   Wt Readings from Last 3 Encounters:  10/22/18 236 lb (107 kg)  06/27/18 228 lb (103.4 kg)  04/25/18 232 lb (105.2 kg)     Physical Exam Constitutional:      General: She is not in acute distress.    Appearance: Normal appearance.  HENT:     Head: Normocephalic and atraumatic.     Left Ear: Tenderness present. There is impacted cerumen.  Neurological:     Mental Status: She is alert.     ASSESSMENT/PLAN:   1. Acute otitis media, unspecified otitis media type Redness noted to the TM and canal with clearing of cerumen. Start augmentin and can continue with otc ear drops for pain.  - amoxicillin-clavulanate (AUGMENTIN) 875-125 MG tablet; Take 1 tablet by mouth every 12 (twelve) hours for 7 days.  Dispense: 14 tablet; Refill: 0        The patient was given clear instructions to  go to ER or return to medical center if symptoms do not improve, worsen or new problems develop. The patient verbalized understanding and agreed with plan of care.   Ms. Freda Jackson. Riley Lam, FNP-BC [facility] [address] [place], [place] [address] [phone]     This note has been created with Dragon speech recognition software and smart phrase technology. Any transcriptional errors are unintentional.

## 2018-10-22 NOTE — Patient Instructions (Signed)

## 2019-01-30 ENCOUNTER — Encounter (HOSPITAL_COMMUNITY): Payer: Self-pay | Admitting: *Deleted

## 2019-01-30 ENCOUNTER — Encounter (HOSPITAL_COMMUNITY): Payer: Self-pay

## 2019-02-04 ENCOUNTER — Other Ambulatory Visit: Payer: Self-pay

## 2019-02-04 ENCOUNTER — Emergency Department (HOSPITAL_COMMUNITY)
Admission: EM | Admit: 2019-02-04 | Discharge: 2019-02-04 | Disposition: A | Discharge status: Home / Self Care | Payer: HRSA Program | Attending: Emergency Medicine | Admitting: Emergency Medicine

## 2019-02-04 ENCOUNTER — Encounter (HOSPITAL_COMMUNITY): Payer: Self-pay | Admitting: Emergency Medicine

## 2019-02-04 DIAGNOSIS — R197 Diarrhea, unspecified: Secondary | ICD-10-CM | POA: Diagnosis not present

## 2019-02-04 DIAGNOSIS — R51 Headache: Secondary | ICD-10-CM | POA: Insufficient documentation

## 2019-02-04 DIAGNOSIS — Z87891 Personal history of nicotine dependence: Secondary | ICD-10-CM | POA: Diagnosis not present

## 2019-02-04 DIAGNOSIS — U071 COVID-19: Secondary | ICD-10-CM | POA: Diagnosis not present

## 2019-02-04 DIAGNOSIS — R05 Cough: Secondary | ICD-10-CM

## 2019-02-04 DIAGNOSIS — R059 Cough, unspecified: Secondary | ICD-10-CM

## 2019-02-04 DIAGNOSIS — R519 Headache, unspecified: Secondary | ICD-10-CM
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
## 2019-02-04 NOTE — ED Triage Notes (Signed)
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

## 2019-02-04 NOTE — Discharge Instructions (Addendum)
Your COVID-19 test is pending.  Would continue taking Motrin and Tylenol for your headaches.  Can take Imodium for diarrhea over-the-counter.  Drink plenty of fluids to make sure that you are staying hydrated.  If your test returns negative and you are continue to have symptoms by the end of the week that she return to ER for further evaluation or if in the meantime he started helping any worsening symptoms including vision changes, lightheadedness, dizziness, chest pain or shortness of breath return.  Make sure you taken your blood pressure several times a day and keeping track of this.     Person Under Monitoring Name: Rhonda Bender  Location: [address] [place] [address]   Infection Prevention Recommendations for Individuals Confirmed to have, or Being Evaluated for, 2019 Novel Coronavirus (COVID-19) Infection Who Receive Care at Home  Individuals who are confirmed to have, or are being evaluated for, COVID-19 should follow the prevention steps below until a healthcare provider or local or state health department says they can return to normal activities.  Stay home except to get medical care You should restrict activities outside your home, except for getting medical care. Do not go to work, school, or public areas, and do not use public transportation or taxis.  Call ahead before visiting your doctor Before your medical appointment, call the healthcare provider and tell them that you have, or are being evaluated for, COVID-19 infection. This will help the healthcare providers office take steps to keep other people from getting infected. Ask your healthcare provider to call the local or state health department.  Monitor your symptoms Seek prompt medical attention if your illness is worsening (e.g., difficulty breathing). Before going to your medical appointment, call the healthcare provider and tell them that you have, or are being evaluated for, COVID-19 infection.  Ask your healthcare provider to call the local or state health department.  Wear a facemask You should wear a facemask that covers your nose and mouth when you are in the same room with other people and when you visit a healthcare provider. People who live with or visit you should also wear a facemask while they are in the same room with you.  Separate yourself from other people in your home As much as possible, you should stay in a different room from other people in your home. Also, you should use a separate bathroom, if available.  Avoid sharing household items You should not share dishes, drinking glasses, cups, eating utensils, towels, bedding, or other items with other people in your home. After using these items, you should wash them thoroughly with soap and water.  Cover your coughs and sneezes Cover your mouth and nose with a tissue when you cough or sneeze, or you can cough or sneeze into your sleeve. Throw used tissues in a lined trash can, and immediately wash your hands with soap and water for at least 20 seconds or use an alcohol-based hand rub.  Wash your [employer] your hands often and thoroughly with soap and water for at least 20 seconds. You can use an alcohol-based hand sanitizer if soap and water are not available and if your hands are not visibly dirty. Avoid touching your eyes, nose, and mouth with unwashed hands.   Prevention Steps for Caregivers and Household Members of Individuals Confirmed to have, or Being Evaluated for, COVID-19 Infection Being Cared for in the Home  If you live with, or provide care at home for, a person confirmed to  have, or being evaluated for, COVID-19 infection please follow these guidelines to prevent infection:  Follow healthcare providers instructions Make sure that you understand and can help the patient follow any healthcare provider instructions for all care.  Provide for the patients basic needs You should help the  patient with basic needs in the home and provide support for getting groceries, prescriptions, and other personal needs.  Monitor the patients symptoms If they are getting sicker, call his or her medical provider and tell them that the patient has, or is being evaluated for, COVID-19 infection. This will help the healthcare providers office take steps to keep other people from getting infected. Ask the healthcare provider to call the local or state health department.  Limit the number of people who have contact with the patient If possible, have only one caregiver for the patient. Other household members should stay in another home or place of residence. If this is not possible, they should stay in another room, or be separated from the patient as much as possible. Use a separate bathroom, if available. Restrict visitors who do not have an essential need to be in the home.  Keep older adults, very young children, and other sick people away from the patient Keep older adults, very young children, and those who have compromised immune systems or chronic health conditions away from the patient. This includes people with chronic heart, lung, or kidney conditions, diabetes, and cancer.  Ensure good ventilation Make sure that shared spaces in the home have good air flow, such as from an air conditioner or an opened window, weather permitting.  Wash your hands often Wash your hands often and thoroughly with soap and water for at least 20 seconds. You can use an alcohol based hand sanitizer if soap and water are not available and if your hands are not visibly dirty. Avoid touching your eyes, nose, and mouth with unwashed hands. Use disposable paper towels to dry your hands. If not available, use dedicated cloth towels and replace them when they become wet.  Wear a facemask and gloves Wear a disposable facemask at all times in the room and gloves when you touch or have contact with the patients  blood, body fluids, and/or secretions or excretions, such as sweat, saliva, sputum, nasal mucus, vomit, urine, or feces.  Ensure the mask fits over your nose and mouth tightly, and do not touch it during use. Throw out disposable facemasks and gloves after using them. Do not reuse. Wash your hands immediately after removing your facemask and gloves. If your personal clothing becomes contaminated, carefully remove clothing and launder. Wash your hands after handling contaminated clothing. Place all used disposable facemasks, gloves, and other waste in a lined container before disposing them with other household waste. Remove gloves and wash your hands immediately after handling these items.  Do not share dishes, glasses, or other household items with the patient Avoid sharing household items. You should not share dishes, drinking glasses, cups, eating utensils, towels, bedding, or other items with a patient who is confirmed to have, or being evaluated for, COVID-19 infection. After the person uses these items, you should wash them thoroughly with soap and water.  Wash laundry thoroughly Immediately remove and wash clothes or bedding that have blood, body fluids, and/or secretions or excretions, such as sweat, saliva, sputum, nasal mucus, vomit, urine, or feces, on them. Wear gloves when handling laundry from the patient. Read and follow directions on labels of laundry or clothing items and  detergent. In general, wash and dry with the warmest temperatures recommended on the label.  Clean all areas the individual has used often Clean all touchable surfaces, such as counters, tabletops, doorknobs, bathroom fixtures, toilets, phones, keyboards, tablets, and bedside tables, every day. Also, clean any surfaces that may have blood, body fluids, and/or secretions or excretions on them. Wear gloves when cleaning surfaces the patient has come in contact with. Use a diluted bleach solution (e.g., dilute  bleach with 1 part bleach and 10 parts water) or a household disinfectant with a label that says EPA-registered for coronaviruses. To make a bleach solution at home, add 1 tablespoon of bleach to 1 quart (4 cups) of water. For a larger supply, add  cup of bleach to 1 gallon (16 cups) of water. Read labels of cleaning products and follow recommendations provided on product labels. Labels contain instructions for safe and effective use of the cleaning product including precautions you should take when applying the product, such as wearing gloves or eye protection and making sure you have good ventilation during use of the product. Remove gloves and wash hands immediately after cleaning.  Monitor yourself for signs and symptoms of illness Caregivers and household members are considered close contacts, should monitor their health, and will be asked to limit movement outside of the home to the extent possible. Follow the monitoring steps for close contacts listed on the symptom monitoring form.   ? If you have additional questions, contact your local health department or call the epidemiologist on call at [phone] (available 24/7). ? This guidance is subject to change. For the most up-to-date guidance from [facility], please refer to their website: TripMetro.huhttps://www.cdc.gov/coronavirus/2019-ncov/hcp/guidance-prevent-spread.html

## 2019-02-05 LAB — NOVEL CORONAVIRUS, NAA (HOSP ORDER, SEND-OUT TO REF LAB; TAT 18-24 HRS): SARS-CoV-2, NAA: DETECTED — AB

## 2020-05-24 ENCOUNTER — Other Ambulatory Visit: Payer: Self-pay

## 2020-05-24 ENCOUNTER — Encounter (HOSPITAL_COMMUNITY): Payer: Self-pay

## 2020-05-24 DIAGNOSIS — J029 Acute pharyngitis, unspecified: Secondary | ICD-10-CM | POA: Insufficient documentation

## 2020-05-24 DIAGNOSIS — R059 Cough, unspecified: Secondary | ICD-10-CM | POA: Insufficient documentation

## 2020-05-24 DIAGNOSIS — Z5321 Procedure and treatment not carried out due to patient leaving prior to being seen by health care provider: Secondary | ICD-10-CM | POA: Insufficient documentation

## 2020-05-24 NOTE — ED Triage Notes (Signed)
Pt came in with cough and sore throat. Pt came in via EMS. No known Covid exposure

## 2020-05-25 ENCOUNTER — Emergency Department (HOSPITAL_COMMUNITY)
Admission: EM | Admit: 2020-05-25 | Discharge: 2020-05-25 | Disposition: A | Discharge status: Home / Self Care | Payer: No Typology Code available for payment source | Attending: Emergency Medicine | Admitting: Emergency Medicine

## 2021-03-25 ENCOUNTER — Other Ambulatory Visit: Payer: Self-pay

## 2021-03-25 ENCOUNTER — Emergency Department (HOSPITAL_BASED_OUTPATIENT_CLINIC_OR_DEPARTMENT_OTHER)
Admission: EM | Admit: 2021-03-25 | Discharge: 2021-03-25 | Disposition: A | Discharge status: Home / Self Care | Payer: PRIVATE HEALTH INSURANCE | Attending: Emergency Medicine | Admitting: Emergency Medicine

## 2021-03-25 ENCOUNTER — Encounter (HOSPITAL_BASED_OUTPATIENT_CLINIC_OR_DEPARTMENT_OTHER): Payer: Self-pay | Admitting: *Deleted

## 2021-03-25 ENCOUNTER — Emergency Department (HOSPITAL_BASED_OUTPATIENT_CLINIC_OR_DEPARTMENT_OTHER): Payer: PRIVATE HEALTH INSURANCE

## 2021-03-25 DIAGNOSIS — W230XXA Caught, crushed, jammed, or pinched between moving objects, initial encounter: Secondary | ICD-10-CM | POA: Diagnosis not present

## 2021-03-25 DIAGNOSIS — S59911A Unspecified injury of right forearm, initial encounter: Secondary | ICD-10-CM | POA: Diagnosis present

## 2021-03-25 DIAGNOSIS — S5011XA Contusion of right forearm, initial encounter: Secondary | ICD-10-CM | POA: Diagnosis not present

## 2021-03-25 DIAGNOSIS — Z87891 Personal history of nicotine dependence: Secondary | ICD-10-CM | POA: Diagnosis not present

## 2021-03-25 DIAGNOSIS — Y990 Civilian activity done for income or pay: Secondary | ICD-10-CM | POA: Diagnosis not present

## 2021-03-25 NOTE — Discharge Instructions (Signed)
Please read and follow all provided instructions.  Your diagnoses today include:  1. Contusion of right forearm, initial encounter     Tests performed today include: An x-ray of the affected area - does NOT show any broken bones Vital signs. See below for your results today.   Medications prescribed:  None  Take any prescribed medications only as directed.  Home care instructions:  Follow any educational materials contained in this packet Follow R.I.C.E. Protocol: R - rest your injury  I  - use ice on injury without applying directly to skin C - compress injury with bandage or splint E - elevate the injury as much as possible  Follow-up instructions: Please follow-up with your primary care provider if you continue to have significant pain in 1 week. In this case you may have a more severe injury that requires further care.   Return instructions:  Please return if your fingers are numb or tingling, appear gray or blue, or you have severe pain (also elevate the arm and loosen splint or wrap if you were given one) Please return to the Emergency Department if you experience worsening symptoms.  Please return if you have any other emergent concerns.  Additional Information:  Your vital signs today were: BP (!) 168/83 (BP Location: Left Arm)   Pulse 76   Temp 98 F (36.7 C) (Oral)   Resp 16   SpO2 100%  If your blood pressure (BP) was elevated above 135/85 this visit, please have this repeated by your doctor within one month. --------------

## 2021-03-25 NOTE — ED Triage Notes (Signed)
Pt was at work when her right arm was crushed by a 30lbs steel bar.  Right forearm is red (she has been icing it) and has a bruised area visible.  Pt continues to have pain and this occurred about 2-3 hours ago.  No neuro deficits in hands, arm is throbbing.
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
## 2021-03-25 NOTE — ED Notes (Signed)
X-ray at bedside

## 2023-08-22 IMAGING — DX DG FOREARM 2V*R*
1 series · 2 of 2 positions shown · non-contrast
Comparison: None.

CLINICAL DATA: Pt was at work when her right arm was crushed by a
14lbs steel bar. Pt continues to have pain and this occurred about
2-3 hours ago.

EXAM:
RIGHT FOREARM - 2 VIEW

[Series 1: forearmbone · 0.14mm/px · 2 of 2 slices shown]
[im 1/2]
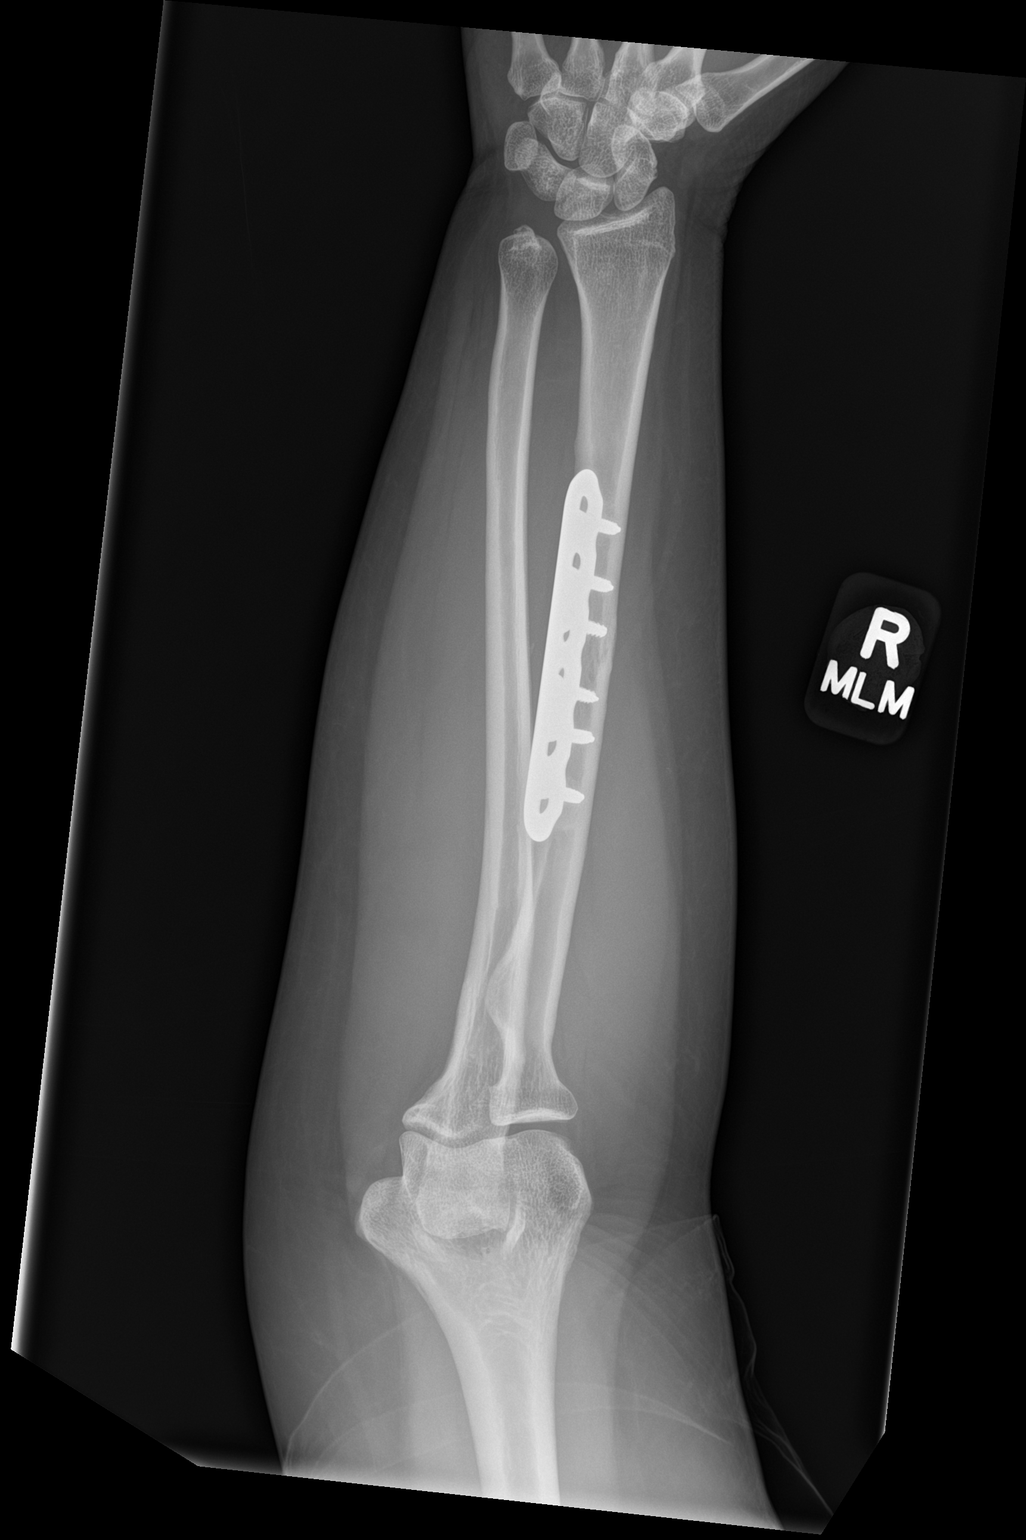
[im 2/2]
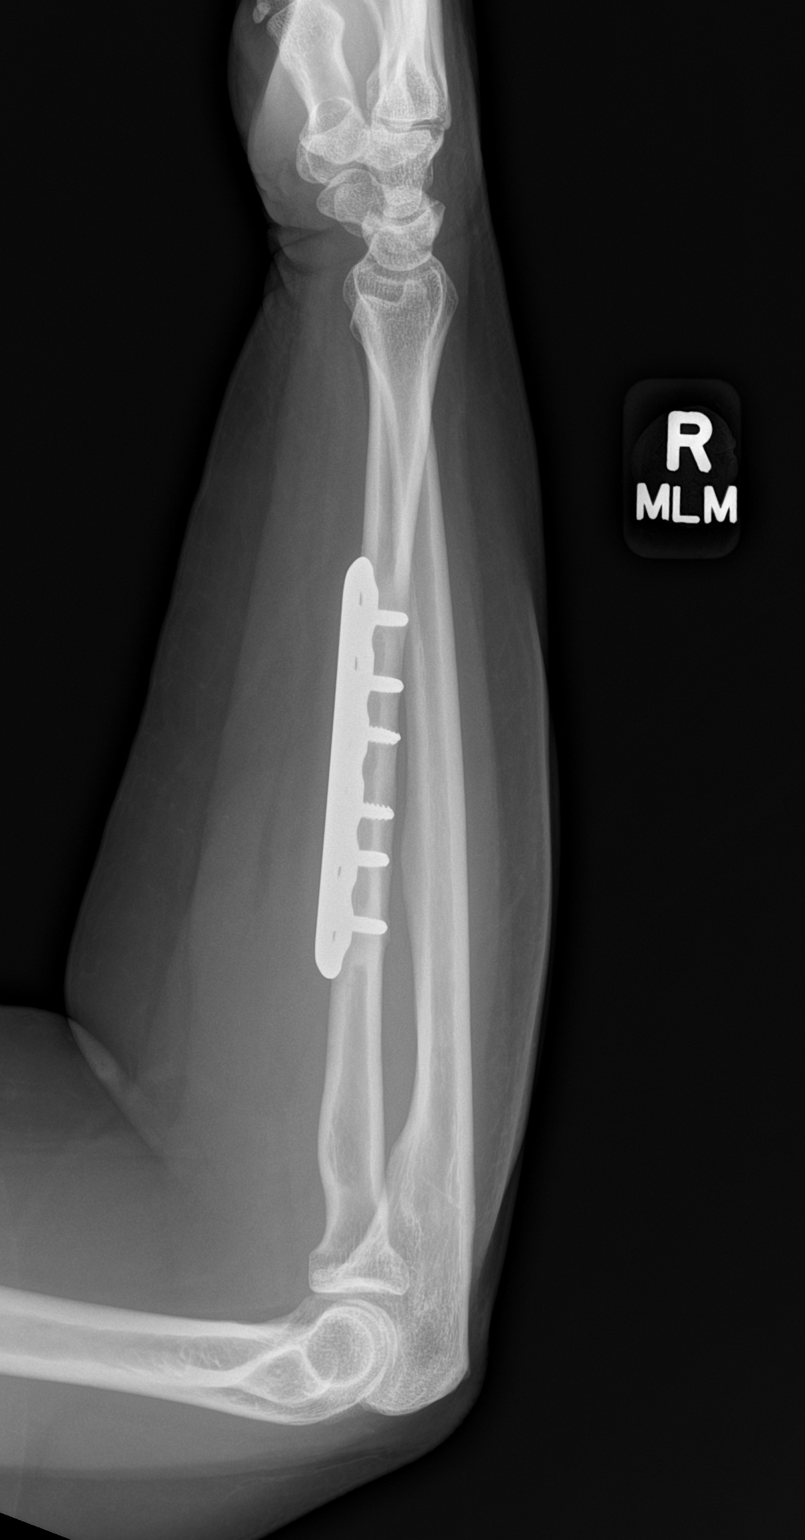

[2 of 2 positions shown; findings below may reference images not displayed]

FINDINGS: Plate and screw fixation of the right radial shaft. Hardware appears
intact. No evidence of acute fracture or dislocation. Mild deformity
of the right radial shaft likely represents sequela of remote prior
injury. Regional soft tissues are unremarkable.
IMPRESSION: No acute osseous abnormality in the right forearm. Prior plate and
screw fixation of the mid right radius.
